# Patient Record
Sex: Male | Born: 1935 | Race: Black or African American | Hispanic: No | Marital: Single | Smoking: Former smoker
Health system: Southern US, Community
[De-identification: ages and names within clinical notes are randomized; demographics above are authoritative.]

## PROBLEM LIST (undated history)

## (undated) DIAGNOSIS — N400 Enlarged prostate without lower urinary tract symptoms: Secondary | ICD-10-CM

## (undated) DIAGNOSIS — I10 Essential (primary) hypertension: Secondary | ICD-10-CM

---

## 1992-08-24 DIAGNOSIS — I639 Cerebral infarction, unspecified: Secondary | ICD-10-CM

## 1992-08-24 HISTORY — DX: Cerebral infarction, unspecified: I63.9

## 2020-06-13 ENCOUNTER — Emergency Department (HOSPITAL_COMMUNITY): Payer: Medicare (Managed Care)

## 2020-06-13 ENCOUNTER — Emergency Department (HOSPITAL_COMMUNITY)
Admission: EM | Admit: 2020-06-13 | Discharge: 2020-06-14 | Disposition: A | Discharge status: Home / Self Care | Payer: Medicare (Managed Care) | Attending: Emergency Medicine | Admitting: Emergency Medicine

## 2020-06-13 ENCOUNTER — Encounter (HOSPITAL_COMMUNITY): Payer: Self-pay

## 2020-06-13 DIAGNOSIS — I10 Essential (primary) hypertension: Secondary | ICD-10-CM | POA: Insufficient documentation

## 2020-06-13 DIAGNOSIS — R531 Weakness: Secondary | ICD-10-CM | POA: Diagnosis not present

## 2020-06-13 DIAGNOSIS — M25512 Pain in left shoulder: Secondary | ICD-10-CM | POA: Insufficient documentation

## 2020-06-13 DIAGNOSIS — Z79899 Other long term (current) drug therapy: Secondary | ICD-10-CM | POA: Diagnosis not present

## 2020-06-13 DIAGNOSIS — R404 Transient alteration of awareness: Secondary | ICD-10-CM | POA: Diagnosis not present

## 2020-06-13 DIAGNOSIS — Z20822 Contact with and (suspected) exposure to covid-19: Secondary | ICD-10-CM | POA: Insufficient documentation

## 2020-06-13 DIAGNOSIS — I6789 Other cerebrovascular disease: Secondary | ICD-10-CM | POA: Diagnosis not present

## 2020-06-13 DIAGNOSIS — E860 Dehydration: Secondary | ICD-10-CM | POA: Insufficient documentation

## 2020-06-13 DIAGNOSIS — Z7982 Long term (current) use of aspirin: Secondary | ICD-10-CM | POA: Diagnosis not present

## 2020-06-13 DIAGNOSIS — R4182 Altered mental status, unspecified: Secondary | ICD-10-CM | POA: Diagnosis present

## 2020-06-13 LAB — CBC WITH DIFFERENTIAL/PLATELET
Abs Immature Granulocytes: 0.04 10*3/uL (ref 0.00–0.07)
Basophils Absolute: 0 10*3/uL (ref 0.0–0.1)
Basophils Relative: 0 %
Eosinophils Absolute: 0 10*3/uL (ref 0.0–0.5)
Eosinophils Relative: 0 %
HCT: 43.5 % (ref 39.0–52.0)
Hemoglobin: 13 g/dL (ref 13.0–17.0)
Immature Granulocytes: 1 %
Lymphocytes Relative: 20 %
Lymphs Abs: 1.2 10*3/uL (ref 0.7–4.0)
MCH: 27.8 pg (ref 26.0–34.0)
MCHC: 29.9 g/dL — ABNORMAL LOW (ref 30.0–36.0)
MCV: 92.9 fL (ref 80.0–100.0)
Monocytes Absolute: 0.5 10*3/uL (ref 0.1–1.0)
Monocytes Relative: 8 %
Neutro Abs: 4.3 10*3/uL (ref 1.7–7.7)
Neutrophils Relative %: 71 %
Platelets: 155 10*3/uL (ref 150–400)
RBC: 4.68 MIL/uL (ref 4.22–5.81)
RDW: 14.2 % (ref 11.5–15.5)
WBC: 6.1 10*3/uL (ref 4.0–10.5)
nRBC: 0 % (ref 0.0–0.2)

## 2020-06-13 LAB — LACTIC ACID, PLASMA: Lactic Acid, Venous: 1.9 mmol/L (ref 0.5–1.9)

## 2020-06-13 LAB — COMPREHENSIVE METABOLIC PANEL
ALT: 13 U/L (ref 0–44)
AST: 22 U/L (ref 15–41)
Albumin: 3 g/dL — ABNORMAL LOW (ref 3.5–5.0)
Alkaline Phosphatase: 38 U/L (ref 38–126)
Anion gap: 14 (ref 5–15)
BUN: 11 mg/dL (ref 8–23)
CO2: 24 mmol/L (ref 22–32)
Calcium: 8.9 mg/dL (ref 8.9–10.3)
Chloride: 99 mmol/L (ref 98–111)
Creatinine, Ser: 1.15 mg/dL (ref 0.61–1.24)
GFR, Estimated: >60 mL/min (ref >60)
Glucose, Bld: 108 mg/dL — ABNORMAL HIGH (ref 70–99)
Potassium: 4.2 mmol/L (ref 3.5–5.1)
Sodium: 137 mmol/L (ref 135–145)
Total Bilirubin: 1.2 mg/dL (ref 0.3–1.2)
Total Protein: 6.2 g/dL — ABNORMAL LOW (ref 6.5–8.1)

## 2020-06-13 LAB — URINALYSIS, ROUTINE W REFLEX MICROSCOPIC
Bacteria, UA: NONE SEEN
Bilirubin Urine: NEGATIVE
Glucose, UA: NEGATIVE mg/dL
Hgb urine dipstick: NEGATIVE
Ketones, ur: 80 mg/dL — AB
Nitrite: NEGATIVE
Protein, ur: 100 mg/dL — AB
Specific Gravity, Urine: 1.015 (ref 1.005–1.030)
pH: 7 (ref 5.0–8.0)

## 2020-06-13 LAB — CBG MONITORING, ED: Glucose-Capillary: 101 mg/dL — ABNORMAL HIGH (ref 70–99)

## 2020-06-13 LAB — RESPIRATORY PANEL BY RT PCR (FLU A&B, COVID)
Influenza A by PCR: NEGATIVE
Influenza B by PCR: NEGATIVE
SARS Coronavirus 2 by RT PCR: NEGATIVE

## 2020-06-13 LAB — PROTIME-INR
INR: 1 (ref 0.8–1.2)
Prothrombin Time: 13.1 seconds (ref 11.4–15.2)

## 2020-06-13 MED ORDER — SODIUM CHLORIDE 0.9 % IV BOLUS
1000.0000 mL | Freq: Once | INTRAVENOUS | Status: AC
  Administered 2020-06-13: 1000 mL via INTRAVENOUS

## 2020-06-13 NOTE — ED Notes (Signed)
Called PTAR 

## 2020-06-13 NOTE — ED Triage Notes (Signed)
Pt bib [facility] from home w/ c/o FTT. Pt moved here 3 weeks from [place]. Since moving pt has become progressively more weak, with decreased appetite, and decreased mental status. Pt AOx4 at baseline, AOx2 with EMS. At baseline pt is independent, however, pt has been unable to get up without assistance. Pt also c/o L shoulder pain since fall 2 months ago. Per family, pt has been taking all of his medications. EMS VSS, EMS EKG unremarkable.

## 2020-06-13 NOTE — ED Provider Notes (Signed)
4:45 PM Care assumed from Dr. Particia Nearing.  At time of transfer care, patient is awaiting results of CT head.  CT head is reassuring, plan is to get an MRI to rule out recurrent stroke.  If MRI is reassuring, anticipate ambulation and discharge home.  This plan was discussed with the patient's family by Dr. Particia Nearing.  Anticipate discharge after the rehydration improved the patient's appearance and reassuring imaging.  If there is evidence of new stroke, anticipate admission.  CT head was completed did not show bleed or acute stroke.  It showed an old stroke.  I spoke with patient's daughter who agreed with the previous plan of MRI and if this is reassuring, discharge home via PTR for PCP follow-up.  Patient is awaiting on MRI results.  7:57 PM MRI just returned showing no acute stroke.  I called the patient's daughter and she is in agreement with discharge.  Patient will need PTAR to get back home.  Patient will need to follow-up with PCP and maintain hydration at home.  He agreed with plan of care and was discharged in good condition.   Clinical Impression: 1. Transient alteration of awareness   2. Dehydration     Disposition: Discharge  Condition: Good  I have discussed the results, Dx and Tx plan with the pt(& family if present). He/she/they expressed understanding and agree(s) with the plan. Discharge instructions discussed at great length. Strict return precautions discussed and pt &/or family have verbalized understanding of the instructions. No further questions at time of discharge.    New Prescriptions   No medications on file    Follow Up: [facility] [address] [place] [address] [phone] Schedule an appointment as soon as possible for a visit    [facility] EMERGENCY DEPARTMENT [address] [id number] mc [place] [address] [phone]          Scotlyn Mccranie,  Canary Brim, MD 06/13/20 2000

## 2020-06-13 NOTE — ED Notes (Signed)
Pt family (daughter) updated. Would like to be notified when patient is being brought home by [facility]. Number is in chart.

## 2020-06-13 NOTE — ED Provider Notes (Signed)
[facility] EMERGENCY DEPARTMENT Provider Note   CSN: [id number] Arrival date & time: 06/13/20  1433     History Chief Complaint  Patient presents with  . Failure To Thrive    Brian Wiley is a 86 y.o. male.  Pt presents to the ED today with AMS.  Pt and his wife have moved to their daughter's house from [place] about 3 weeks ago.  His daughter gives most of the hx.  He has gotten progressively weak over the last week.  He's been sleeping a lot more.  He has had a decreased appetite.  Daughter could not get him out of bed this morning.  Pt has not taken his meds today, but has been compliant.  Pt has some left shoulder pain from a fall about 2 months ago, but no new pain.        PMHx:  HTN, CVA, BPH, high cholesterol  PSurgHx:  Appendectomy   No family history on file.  Social History   Tobacco Use  . Smoking status: Not on file  Substance Use Topics  . Alcohol use: Not on file  . Drug use: Not on file  No tobacco or alcohol for 35 years  Home Medications Prior to Admission medications   Medication Sig Start Date End Date Taking? Authorizing Provider  aspirin EC 81 MG tablet Take 81 mg by mouth daily. Swallow whole.   Yes [provider]  atorvastatin (LIPITOR) 10 MG tablet Take 10 mg by mouth daily.   Yes [provider]  carvedilol (COREG) 6.25 MG tablet Take 6.25 mg by mouth 2 (two) times daily with a meal.   Yes [provider]  finasteride (PROSCAR) 5 MG tablet Take 5 mg by mouth at bedtime.   Yes [provider]  hydrochlorothiazide (HYDRODIURIL) 25 MG tablet Take 25 mg by mouth daily.   Yes [provider]  tamsulosin (FLOMAX) 0.4 MG CAPS capsule Take 0.4 mg by mouth at bedtime.   Yes [provider]    Allergies    Patient has no known allergies. NKDA  Review of Systems   Review of Systems  Musculoskeletal:       Left shoulder pain  Neurological: Positive for weakness.  All  other systems reviewed and are negative.   Physical Exam Updated Vital Signs BP (!) 179/85   Pulse (!) 44   Temp 97.8 F (36.6 C) (Oral)   Resp 16   SpO2 97%   Physical Exam Vitals and nursing note reviewed.  Constitutional:      Appearance: Normal appearance.  HENT:     Head: Normocephalic and atraumatic.     Right Ear: External ear normal.     Left Ear: External ear normal.     Nose: Nose normal.     Mouth/Throat:     Mouth: Mucous membranes are dry.  Eyes:     Extraocular Movements: Extraocular movements intact.     Conjunctiva/sclera: Conjunctivae normal.     Pupils: Pupils are equal, round, and reactive to light.  Cardiovascular:     Rate and Rhythm: Normal rate and regular rhythm.     Pulses: Normal pulses.     Heart sounds: Normal heart sounds.  Pulmonary:     Effort: Pulmonary effort is normal.     Breath sounds: Normal breath sounds.  Abdominal:     General: Abdomen is flat. Bowel sounds are normal.     Palpations: Abdomen is soft.  Musculoskeletal:  General: Normal range of motion.     Cervical back: Normal range of motion and neck supple.  Skin:    General: Skin is warm.     Capillary Refill: Capillary refill takes less than 2 seconds.  Neurological:     Mental Status: He is alert. He is confused.     Comments: Right arm and leg weakness from prior stroke.  Psychiatric:        Mood and Affect: Mood normal.        Behavior: Behavior normal.     ED Results / Procedures / Treatments   Labs (all labs ordered are listed, but only abnormal results are displayed) Labs Reviewed  CBC WITH DIFFERENTIAL/PLATELET - Abnormal; Notable for the following components:      Result Value   MCHC 29.9 (*)    All other components within normal limits  COMPREHENSIVE METABOLIC PANEL - Abnormal; Notable for the following components:   Glucose, Bld 108 (*)    Total Protein 6.2 (*)    Albumin 3.0 (*)    All other components within normal limits  URINALYSIS,  ROUTINE W REFLEX MICROSCOPIC - Abnormal; Notable for the following components:   Ketones, ur 80 (*)    Protein, ur 100 (*)    Leukocytes,Ua TRACE (*)    All other components within normal limits  CBG MONITORING, ED - Abnormal; Notable for the following components:   Glucose-Capillary 101 (*)    All other components within normal limits  RESPIRATORY PANEL BY RT PCR (FLU A&B, COVID)  LACTIC ACID, PLASMA  PROTIME-INR  AMMONIA    EKG EKG Interpretation  Date/Time:  Thursday June 13 2020 14:43:55 EDT Ventricular Rate:  65 PR Interval:    QRS Duration: 99 QT Interval:  451 QTC Calculation: 469 R Axis:   6 Text Interpretation: Sinus rhythm No old tracing to compare Confirmed by Jacalyn Lefevre [phone]) on 06/13/2020 3:54:40 PM   Radiology DG Chest Portable 1 View  Result Date: 06/13/2020 CLINICAL DATA:  Cough. EXAM: PORTABLE CHEST 1 VIEW COMPARISON:  None. FINDINGS: Mild cardiomegaly is noted. No pneumothorax is noted. Left lung is clear. Elevated right hemidiaphragm is noted with mild right basilar subsegmental atelectasis. Bony thorax is unremarkable. IMPRESSION: Elevated right hemidiaphragm with mild right basilar subsegmental atelectasis. Aortic Atherosclerosis (ICD10-I70.0). Electronically Signed   By: Lupita Raider M.D.   On: 06/13/2020 15:21   DG Shoulder Left Portable  Result Date: 06/13/2020 CLINICAL DATA:  Weakness.  Cough. EXAM: LEFT SHOULDER COMPARISON:  No prior. FINDINGS: Acromioclavicular and glenohumeral degenerative change. No acute bony abnormality identified. No evidence of fracture or dislocation. IMPRESSION: Acromioclavicular and glenohumeral degenerative change. No acute abnormality identified. Electronically Signed   By: Maisie Fus  Register   On: 06/13/2020 15:21    Procedures Procedures (including critical care time)  Medications Ordered in ED Medications  sodium chloride 0.9 % bolus 1,000 mL (1,000 mLs Intravenous New Bag/Given 06/13/20 1512)    ED  Course  I have reviewed the triage vital signs and the nursing notes.  Pertinent labs & imaging results that were available during my care of the patient were reviewed by me and considered in my medical decision making (see chart for details).    MDM Rules/Calculators/A&P                          Labs unremarkable.  UA shows ketones, but no infections.  Pt's CT head is pending.  Pt's daughter updated.  PT signed out to Dr. Rush Landmark at shift change.  Covid is pending.  Pt has been fully vaccinated.  Pt is looking better after fluids.  Final Clinical Impression(s) / ED Diagnoses Final diagnoses:  Transient alteration of awareness  Dehydration    Rx / DC Orders ED Discharge Orders    None       Jacalyn Lefevre, MD 06/13/20 [id number]

## 2020-06-13 NOTE — Discharge Instructions (Signed)
Your imaging today showed chronic brain changes but no evidence of acute bleed or acute strokes.  The previous team's plan was to let you go home if your imaging did not show acute stroke which he did not.  You were rehydrated today with IV fluids so please try to continue improving your hydration at home.  Please follow-up with a primary care physician.  If any symptoms change or worsen, please return to the nearest emergency department.

## 2020-06-14 NOTE — ED Notes (Signed)
Patient removing cardiac leads/bp cuff/clothing. Patient refusing to keep anything on.

## 2020-06-18 ENCOUNTER — Observation Stay (HOSPITAL_COMMUNITY): Payer: Medicare (Managed Care)

## 2020-06-18 ENCOUNTER — Inpatient Hospital Stay (HOSPITAL_COMMUNITY)
Admission: EM | Admit: 2020-06-18 | Discharge: 2020-06-22 | DRG: 690 | Disposition: A | Discharge status: Skilled Nursing Facility | Payer: Medicare (Managed Care) | Attending: Osteopathic Medicine | Admitting: Osteopathic Medicine

## 2020-06-18 ENCOUNTER — Encounter (HOSPITAL_COMMUNITY): Payer: Self-pay

## 2020-06-18 ENCOUNTER — Emergency Department (HOSPITAL_COMMUNITY): Payer: Medicare (Managed Care)

## 2020-06-18 DIAGNOSIS — J9811 Atelectasis: Secondary | ICD-10-CM | POA: Diagnosis present

## 2020-06-18 DIAGNOSIS — R410 Disorientation, unspecified: Secondary | ICD-10-CM | POA: Diagnosis present

## 2020-06-18 DIAGNOSIS — G9349 Other encephalopathy: Secondary | ICD-10-CM | POA: Diagnosis present

## 2020-06-18 DIAGNOSIS — N390 Urinary tract infection, site not specified: Principal | ICD-10-CM | POA: Diagnosis present

## 2020-06-18 DIAGNOSIS — N400 Enlarged prostate without lower urinary tract symptoms: Secondary | ICD-10-CM | POA: Diagnosis present

## 2020-06-18 DIAGNOSIS — Z8673 Personal history of transient ischemic attack (TIA), and cerebral infarction without residual deficits: Secondary | ICD-10-CM

## 2020-06-18 DIAGNOSIS — E876 Hypokalemia: Secondary | ICD-10-CM | POA: Diagnosis present

## 2020-06-18 DIAGNOSIS — N179 Acute kidney failure, unspecified: Secondary | ICD-10-CM | POA: Diagnosis present

## 2020-06-18 DIAGNOSIS — Z20822 Contact with and (suspected) exposure to covid-19: Secondary | ICD-10-CM | POA: Diagnosis present

## 2020-06-18 DIAGNOSIS — G9340 Encephalopathy, unspecified: Secondary | ICD-10-CM | POA: Diagnosis present

## 2020-06-18 DIAGNOSIS — I10 Essential (primary) hypertension: Secondary | ICD-10-CM | POA: Diagnosis present

## 2020-06-18 DIAGNOSIS — Z79899 Other long term (current) drug therapy: Secondary | ICD-10-CM

## 2020-06-18 DIAGNOSIS — R32 Unspecified urinary incontinence: Secondary | ICD-10-CM | POA: Diagnosis present

## 2020-06-18 DIAGNOSIS — R531 Weakness: Secondary | ICD-10-CM | POA: Diagnosis not present

## 2020-06-18 DIAGNOSIS — Z87891 Personal history of nicotine dependence: Secondary | ICD-10-CM

## 2020-06-18 DIAGNOSIS — E860 Dehydration: Secondary | ICD-10-CM | POA: Diagnosis present

## 2020-06-18 DIAGNOSIS — Z7982 Long term (current) use of aspirin: Secondary | ICD-10-CM

## 2020-06-18 DIAGNOSIS — R4182 Altered mental status, unspecified: Secondary | ICD-10-CM

## 2020-06-18 DIAGNOSIS — R778 Other specified abnormalities of plasma proteins: Secondary | ICD-10-CM | POA: Diagnosis present

## 2020-06-18 DIAGNOSIS — F05 Delirium due to known physiological condition: Secondary | ICD-10-CM | POA: Diagnosis present

## 2020-06-18 HISTORY — DX: Essential (primary) hypertension: I10

## 2020-06-18 HISTORY — DX: Benign prostatic hyperplasia without lower urinary tract symptoms: N40.0

## 2020-06-18 LAB — CBC WITH DIFFERENTIAL/PLATELET
Abs Immature Granulocytes: 0.06 10*3/uL (ref 0.00–0.07)
Basophils Absolute: 0 10*3/uL (ref 0.0–0.1)
Basophils Relative: 0 %
Eosinophils Absolute: 0.1 10*3/uL (ref 0.0–0.5)
Eosinophils Relative: 1 %
HCT: 44.3 % (ref 39.0–52.0)
Hemoglobin: 13.4 g/dL (ref 13.0–17.0)
Immature Granulocytes: 1 %
Lymphocytes Relative: 23 %
Lymphs Abs: 2.2 10*3/uL (ref 0.7–4.0)
MCH: 27.6 pg (ref 26.0–34.0)
MCHC: 30.2 g/dL (ref 30.0–36.0)
MCV: 91.2 fL (ref 80.0–100.0)
Monocytes Absolute: 1.2 10*3/uL — ABNORMAL HIGH (ref 0.1–1.0)
Monocytes Relative: 12 %
Neutro Abs: 6.2 10*3/uL (ref 1.7–7.7)
Neutrophils Relative %: 63 %
Platelets: 182 10*3/uL (ref 150–400)
RBC: 4.86 MIL/uL (ref 4.22–5.81)
RDW: 14.5 % (ref 11.5–15.5)
WBC: 9.8 10*3/uL (ref 4.0–10.5)
nRBC: 0 % (ref 0.0–0.2)

## 2020-06-18 LAB — URINALYSIS, ROUTINE W REFLEX MICROSCOPIC
Bilirubin Urine: NEGATIVE
Glucose, UA: NEGATIVE mg/dL
Ketones, ur: NEGATIVE mg/dL
Nitrite: NEGATIVE
Protein, ur: 30 mg/dL — AB
Specific Gravity, Urine: 1.019 (ref 1.005–1.030)
pH: 5 (ref 5.0–8.0)

## 2020-06-18 LAB — COMPREHENSIVE METABOLIC PANEL
ALT: 14 U/L (ref 0–44)
AST: 20 U/L (ref 15–41)
Albumin: 3.1 g/dL — ABNORMAL LOW (ref 3.5–5.0)
Alkaline Phosphatase: 41 U/L (ref 38–126)
Anion gap: 10 (ref 5–15)
BUN: 28 mg/dL — ABNORMAL HIGH (ref 8–23)
CO2: 31 mmol/L (ref 22–32)
Calcium: 8.9 mg/dL (ref 8.9–10.3)
Chloride: 97 mmol/L — ABNORMAL LOW (ref 98–111)
Creatinine, Ser: 1.7 mg/dL — ABNORMAL HIGH (ref 0.61–1.24)
GFR, Estimated: 39 mL/min — ABNORMAL LOW (ref >60)
Glucose, Bld: 140 mg/dL — ABNORMAL HIGH (ref 70–99)
Potassium: 3.1 mmol/L — ABNORMAL LOW (ref 3.5–5.1)
Sodium: 138 mmol/L (ref 135–145)
Total Bilirubin: 0.8 mg/dL (ref 0.3–1.2)
Total Protein: 6.5 g/dL (ref 6.5–8.1)

## 2020-06-18 LAB — TROPONIN I (HIGH SENSITIVITY)
Troponin I (High Sensitivity): 23 ng/L — ABNORMAL HIGH (ref <18)
Troponin I (High Sensitivity): 21 ng/L — ABNORMAL HIGH (ref <18)

## 2020-06-18 LAB — RESPIRATORY PANEL BY RT PCR (FLU A&B, COVID)
Influenza A by PCR: NEGATIVE
Influenza B by PCR: NEGATIVE
SARS Coronavirus 2 by RT PCR: NEGATIVE

## 2020-06-18 LAB — MAGNESIUM: Magnesium: 2.3 mg/dL (ref 1.7–2.4)

## 2020-06-18 LAB — AMMONIA: Ammonia: 35 umol/L (ref 9–35)

## 2020-06-18 MED ORDER — ACETAMINOPHEN 325 MG PO TABS: 650.0000 mg | ORAL_TABLET | Freq: Four times a day (QID) | ORAL | Status: DC | PRN

## 2020-06-18 MED ORDER — ENOXAPARIN SODIUM 40 MG/0.4ML ~~LOC~~ SOLN
40.0000 mg | SUBCUTANEOUS | Status: DC
  Administered 2020-06-18 – 2020-06-21 (×4): 40 mg via SUBCUTANEOUS
  Filled 2020-06-18 (×4): qty 0.4

## 2020-06-18 MED ORDER — ACETAMINOPHEN 650 MG RE SUPP: 650.0000 mg | Freq: Four times a day (QID) | RECTAL | Status: DC | PRN

## 2020-06-18 MED ORDER — POLYETHYLENE GLYCOL 3350 17 G PO PACK: 17.0000 g | PACK | Freq: Every day | ORAL | Status: DC | PRN

## 2020-06-18 MED ORDER — SODIUM CHLORIDE 0.9 % IV SOLN
1.0000 g | Freq: Every day | INTRAVENOUS | Status: DC
  Administered 2020-06-18 – 2020-06-21 (×4): 1 g via INTRAVENOUS
  Filled 2020-06-18: qty 1
  Filled 2020-06-18 (×4): qty 10

## 2020-06-18 MED ORDER — BISACODYL 5 MG PO TBEC: 5.0000 mg | DELAYED_RELEASE_TABLET | Freq: Every day | ORAL | Status: DC | PRN

## 2020-06-18 MED ORDER — SODIUM CHLORIDE 0.9 % IV BOLUS
1000.0000 mL | Freq: Once | INTRAVENOUS | Status: AC
  Administered 2020-06-18: 1000 mL via INTRAVENOUS

## 2020-06-18 MED ORDER — FINASTERIDE 5 MG PO TABS
5.0000 mg | ORAL_TABLET | Freq: Every day | ORAL | Status: DC
  Administered 2020-06-18 – 2020-06-21 (×4): 5 mg via ORAL
  Filled 2020-06-18 (×5): qty 1

## 2020-06-18 MED ORDER — SODIUM CHLORIDE 0.9 % IV SOLN: 1.0000 g | INTRAVENOUS | Status: DC

## 2020-06-18 MED ORDER — SODIUM CHLORIDE 0.9 % IV SOLN
INTRAVENOUS | Status: AC
  Administered 2020-06-20: 1 mL via INTRAVENOUS

## 2020-06-18 MED ORDER — SODIUM CHLORIDE 0.9% FLUSH
3.0000 mL | Freq: Two times a day (BID) | INTRAVENOUS | Status: DC
  Administered 2020-06-18 – 2020-06-22 (×7): 3 mL via INTRAVENOUS

## 2020-06-18 MED ORDER — ASPIRIN EC 81 MG PO TBEC
81.0000 mg | DELAYED_RELEASE_TABLET | Freq: Every day | ORAL | Status: DC
  Administered 2020-06-19 – 2020-06-22 (×4): 81 mg via ORAL
  Filled 2020-06-18 (×4): qty 1

## 2020-06-18 MED ORDER — TAMSULOSIN HCL 0.4 MG PO CAPS
0.4000 mg | ORAL_CAPSULE | Freq: Every day | ORAL | Status: DC
  Administered 2020-06-18 – 2020-06-21 (×4): 0.4 mg via ORAL
  Filled 2020-06-18 (×4): qty 1

## 2020-06-18 MED ORDER — CARVEDILOL 6.25 MG PO TABS
6.2500 mg | ORAL_TABLET | Freq: Two times a day (BID) | ORAL | Status: DC
  Administered 2020-06-19 – 2020-06-22 (×6): 6.25 mg via ORAL
  Filled 2020-06-18 (×6): qty 1

## 2020-06-18 NOTE — ED Triage Notes (Signed)
To room via EMS from home.  Pt seen in ED 06-13-20 for AMS and weakness.  Pt just moved to daughters home from [place] about a month ago and since then has been getting weaker.   Pt Alert to name and DOB, states he is in [place], not oriented to situation, time.   EMS BP 140/90 HR 88 RR 20 SpO2 92% Temp 98.0 BS 201

## 2020-06-18 NOTE — ED Provider Notes (Signed)
[facility] EMERGENCY DEPARTMENT Provider Note   CSN: [id number] Arrival date & time: 06/18/20  1626     History Chief Complaint  Patient presents with   Weakness    Brian Wiley is a 86 y.o. 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 Patient just moved from [place] with his wife to live with his daughter here 4 weeks ago. Most of the history were obtained from his daughter. She states that patient is alert and able to perform all ADLs independently at baseline. However 1 week ago patient started to decline and refused to get out of bed. He also found urine and feces on bed. He also seems to be weaker and not ambulating as well. She states that his memory and cognitive function also declining. Patient did have memory issue when he was in [place] and she thinks that the new moving has made it worse. Patient has not had any falling episodes in the last 6 months. Patient was seen on 10/21/121 for the same issue and had unremarkable CBC, CMP, UA, lactic acid. CT and brain MRI were also negative for acute changes. His daughter states that patient is not improved since he was discharged.  He is living with his wife who just finished treatment for breast cancer and cannot take care of patient in the state.  They are in the process of getting home health. During examination, patient is somnolent but arousable to verbal stimulation. He is oriented to person and place. Patient denies chest pain, shortness of breath, abdominal pain, headache, urinary symptoms, constipation, diarrhea, new weakness of extremities, or headache.  HPI     History reviewed. No pertinent past medical history.  Patient Active Problem List   Diagnosis Date Noted   Disorientation 06/18/2020   Altered mental status 06/18/2020   AKI (acute kidney injury) (HCC) 06/18/2020   Hypokalemia 06/18/2020    Elevated troponin 06/18/2020    History reviewed. No pertinent surgical history.     History reviewed. No pertinent family history.  Social History   Tobacco Use   Smoking status: Not on file  Substance Use Topics   Alcohol use: Not on file   Drug use: Not on file    Home Medications Prior to Admission medications   Medication Sig Start Date End Date Taking? Authorizing Provider  aspirin EC 81 MG tablet Take 81 mg by mouth daily. Swallow whole.    [provider]  atorvastatin (LIPITOR) 10 MG tablet Take 10 mg by mouth daily.    [provider]  carvedilol (COREG) 6.25 MG tablet Take 6.25 mg by mouth 2 (two) times daily with a meal.    [provider]  finasteride (PROSCAR) 5 MG tablet Take 5 mg by mouth at bedtime.    [provider]  hydrochlorothiazide (HYDRODIURIL) 25 MG tablet Take 25 mg by mouth daily.    [provider]  tamsulosin (FLOMAX) 0.4 MG CAPS capsule Take 0.4 mg by mouth at bedtime.    [provider]    Allergies    Patient has no known allergies.  Review of Systems   Review of Systems  Constitutional: Positive for appetite change. Negative for chills and fever.  Respiratory: Positive for cough. Negative for shortness of breath.   Cardiovascular: Negative for chest pain.  Gastrointestinal: Negative for abdominal pain, constipation and diarrhea.  Genitourinary: Negative for difficulty urinating, dysuria  and urgency.  Neurological: Negative for weakness and headaches.    Physical Exam Updated Vital Signs BP 128/86    Pulse 64    Resp 17    SpO2 91%   Physical Exam Constitutional:      General: He is not in acute distress.    Appearance: He is obese.     Comments: Somnolent but arousable to verbal stimulation  HENT:     Head: Normocephalic.  Eyes:     General:        Right eye: No discharge.        Left eye: No discharge.     Extraocular Movements: Extraocular movements intact.      Conjunctiva/sclera: Conjunctivae normal.     Pupils: Pupils are equal, round, and reactive to light.  Cardiovascular:     Rate and Rhythm: Normal rate and regular rhythm.  Pulmonary:     Effort: No respiratory distress.     Breath sounds: Normal breath sounds. No wheezing.  Abdominal:     General: Bowel sounds are normal.     Palpations: Abdomen is soft.     Tenderness: There is no abdominal tenderness.  Musculoskeletal:        General: No tenderness. Normal range of motion.     Cervical back: Normal range of motion.     Right lower leg: No edema.     Left lower leg: No edema.  Skin:    General: Skin is warm.     Coloration: Skin is not jaundiced.  Neurological:     Comments: PERRLA Cranial nerves no deficit Strength 5/5 left upper extremity Slightly weaker right upper extremity Strength 5/5 left and right lower extremities  Psychiatric:        Mood and Affect: Mood normal.     ED Results / Procedures / Treatments   Labs (all labs ordered are listed, but only abnormal results are displayed) Labs Reviewed  URINALYSIS, ROUTINE W REFLEX MICROSCOPIC - Abnormal; Notable for the following components:      Result Value   APPearance HAZY (*)    Hgb urine dipstick SMALL (*)    Protein, ur 30 (*)    Leukocytes,Ua MODERATE (*)    Bacteria, UA RARE (*)    All other components within normal limits  COMPREHENSIVE METABOLIC PANEL - Abnormal; Notable for the following components:   Potassium 3.1 (*)    Chloride 97 (*)    Glucose, Bld 140 (*)    BUN 28 (*)    Creatinine, Ser 1.70 (*)    Albumin 3.1 (*)    GFR, Estimated 39 (*)    All other components within normal limits  CBC WITH DIFFERENTIAL/PLATELET - Abnormal; Notable for the following components:   Monocytes Absolute 1.2 (*)    All other components within normal limits  TROPONIN I (HIGH SENSITIVITY) - Abnormal; Notable for the following components:   Troponin I (High Sensitivity) 23 (*)    All other components within  normal limits  RESPIRATORY PANEL BY RT PCR (FLU A&B, COVID)  URINE CULTURE  AMMONIA  TROPONIN I (HIGH SENSITIVITY)    EKG EKG Interpretation  Date/Time:  Tuesday June 18 2020 16:37:16 EDT Ventricular Rate:  85 PR Interval:    QRS Duration: 102 QT Interval:  394 QTC Calculation: 469 R Axis:   14 Text Interpretation: Sinus rhythm Atrial premature complexes No significant change since last tracing Confirmed by Alvira Monday ([id number]) on 06/18/2020 5:37:17 PM   Radiology DG Chest 2 View  Result Date: 06/18/2020 CLINICAL DATA:  Generalized weakness EXAM: CHEST - 2 VIEW COMPARISON:  Radiograph 06/13/2020 FINDINGS: Chronic elevation of the right hemidiaphragm with some bandlike opacity in the right mid lung compatible with subsegmental atelectasis or scarring. Some additional hazy opacity in the lung bases and in particular the right basilar periphery likely reflecting further atelectatic change though early airspace disease is difficult to fully exclude. Stable cardiomediastinal contours with a calcified aorta. No pneumothorax. No visible effusion. No acute osseous or soft tissue abnormality. Telemetry leads overlie the chest. IMPRESSION: Chronic elevation of the right hemidiaphragm with likely adjacent subsegmental atelectasis or scarring. Additional hazy basilar opacity particularly within the right lung base likely reflecting further atelectatic change though early airspace disease is difficult to fully exclude. Aortic Atherosclerosis (ICD10-I70.0). Electronically Signed   By: Kreg Shropshire M.D.   On: 06/18/2020 18:51    Procedures Procedures (including critical care time)  Medications Ordered in ED Medications  sodium chloride 0.9 % bolus 1,000 mL (has no administration in time range)    ED Course  I have reviewed the triage vital signs and the nursing notes.  Pertinent labs & imaging results that were available during my care of the patient were reviewed by me and considered  in my medical decision making (see chart for details).     MDM Rules/Calculators/A&P                          Patient presents to the ED for altered mental status.  Patient was recently relocated to [place] from [facility] 4 weeks ago.  Per his daughter, patient started to decline 1 week ago in which he becomes less responsive, refused to get out of bed, reduced appetite.  Family also found urine and feces on his bed.  Patient is interactive and able to perform his ADLs independently at baseline.  He was seen on 06/13/2020 for similar complaint and had a normal CT and brain MRI.  His lab works were also reassuring.  His daughter states that patient is not improving after discharge and they are in the process of getting home health.  CBC unremarkable.  CMP shows AKI with creatinine of 1.7 likely due to dehydration and low p.o. intake.  Potassium is low at 3.1.  Ammonia negative.  Troponin is now elevated 23 but low suspicion for ACS.  EKG is also unremarkable.  His UA suggest that patient may have a UTI.  Patient will be admitted for treatment of his UTI, hypokalemia and AKI and hopefully to improve his altered mental status.  Signed out to Dr. Alinda Money.   Final Clinical Impression(s) / ED Diagnoses Final diagnoses:  Altered mental status, unspecified altered mental status type  AKI (acute kidney injury) (HCC)  Hypokalemia  Elevated troponin    Rx / DC Orders ED Discharge Orders    None       Doran Stabler, DO 06/18/20 2151    Alvira Monday, MD 06/20/20 1549

## 2020-06-18 NOTE — H&P (Signed)
History and Physical   Brian Wiley DGL:[id number] DOB: October 08, 1932 DOA: 06/18/2020  PCP: Patient, [name]   Patient coming from: Home  Chief Complaint: Encephalopathy  HPI: Brian Wiley is a 86 y.o. male with medical history significant of CVA in 1994, hypertension, BPH, arthritis who presents with progressive encephalopathy.  Patient unable to significantly participate in history given his encephalopathy, most history obtained via chart review and with assistance of family.  Patient and his wife moved to their daughter's house from [facility] about 1 month ago.  Since arriving, his daughter has noticed him getting progressively weak and this accelerated some 2 weeks ago and he became more lethargic and began eating less and sleeping more.  Patient was brought to the emergency department on 1021 for evaluation of this patient was determined to be dehydrated and he improved after fluids.  Given his history of stroke he was reevaluated with imaging including CT head and MRI which were negative for repeat infarct or bleed.  Patient was discharged home from the ED at that time.  Daughter states that since discharge he has continued to have progressive weakness and encephalopathy.  He is now alert and oriented to person only.  He continues to have a decreased appetite and sleeps most of the day.  He has had some episodes of incontinence.  She had worked on getting some home health and when the home health nursing came to evaluate patient they recommend him be seen in the ED.  Per her report he has been having increased urinary frequency in addition to looser stools.  She has not noted nor has he complained of fever, chest pain, shortness of breath, abdominal pain.  ED Course: Vital signs stable in the ED.  Lab work significant for K3.1, creatinine 1.7 (baseline 1.15).  Initial troponin mildly elevated at 23, repeat pending.  Ammonia negative, respiratory viral panel for flu and Covid negative.  UA  showed evidence of UTI, that did not exist on prior UA on 10/21.  Checks x-ray showed continued right lower lobe atelectasis.   Review of Systems: As per HPI otherwise all other systems reviewed and are negative.  Past Medical History:  Diagnosis Date  . BPH (benign prostatic hyperplasia)   . CVA (cerebral vascular accident) (HCC) 1994  . HTN (hypertension)    History reviewed. No pertinent surgical history.  Social History  reports that he has quit smoking. His smoking use included cigarettes. He does not have any smokeless tobacco history on file. He reports previous alcohol use. No history on file for drug use.  No Known Allergies  History reviewed. No pertinent family history.  Prior to Admission medications   Medication Sig Start Date End Date Taking? Authorizing Provider  aspirin EC 81 MG tablet Take 81 mg by mouth daily. Swallow whole.    [provider]  atorvastatin (LIPITOR) 10 MG tablet Take 10 mg by mouth daily.    [provider]  carvedilol (COREG) 6.25 MG tablet Take 6.25 mg by mouth 2 (two) times daily with a meal.    [provider]  finasteride (PROSCAR) 5 MG tablet Take 5 mg by mouth at bedtime.    [provider]  hydrochlorothiazide (HYDRODIURIL) 25 MG tablet Take 25 mg by mouth daily.    [provider]  tamsulosin (FLOMAX) 0.4 MG CAPS capsule Take 0.4 mg by mouth at bedtime.    [provider]    Physical Exam: Vitals:   06/18/20 1900  BP:  128/86  Pulse: 64  Resp: 17  SpO2: 91%   Physical Exam Constitutional:      General: He is not in acute distress.    Appearance: Normal appearance.     Comments: Pleasantly confused elderly male  HENT:     Head: Normocephalic and atraumatic.     Mouth/Throat:     Mouth: Mucous membranes are moist.     Pharynx: Oropharynx is clear.  Eyes:     Extraocular Movements: Extraocular movements intact.     Pupils: Pupils are equal, round, and reactive to light.   Cardiovascular:     Rate and Rhythm: Normal rate and regular rhythm.     Pulses: Normal pulses.     Heart sounds: Normal heart sounds.  Pulmonary:     Effort: Pulmonary effort is normal. No respiratory distress.     Breath sounds: Normal breath sounds.  Abdominal:     General: Bowel sounds are normal. There is no distension.     Tenderness: There is no abdominal tenderness.     Comments: Somewhat firm abdomen, mildly increased bowl sounds  Musculoskeletal:        General: No swelling or deformity.  Skin:    General: Skin is warm and dry.  Neurological:     General: No focal deficit present.     Mental Status: Mental status is at baseline.     Comments: Alert and oriented to person only Able to move all 4 extremities spontaneously against resistance Cranial nerve exam grossly intact Patient able to follow simple commands     Labs on Admission: I have personally reviewed following labs and imaging studies  CBC: Recent Labs  Lab 06/13/20 1448 06/18/20 1844  WBC 6.1 9.8  NEUTROABS 4.3 6.2  HGB 13.0 13.4  HCT 43.5 44.3  MCV 92.9 91.2  PLT 155 182    Basic Metabolic Panel: Recent Labs  Lab 06/13/20 1448 06/18/20 1844  NA 137 138  K 4.2 3.1*  CL 99 97*  CO2 24 31  GLUCOSE 108* 140*  BUN 11 28*  CREATININE 1.15 1.70*  CALCIUM 8.9 8.9    GFR: CrCl cannot be calculated (Unknown ideal weight.).  Liver Function Tests: Recent Labs  Lab 06/13/20 1448 06/18/20 1844  AST 22 20  ALT 13 14  ALKPHOS 38 41  BILITOT 1.2 0.8  PROT 6.2* 6.5  ALBUMIN 3.0* 3.1*    Urine analysis:    Component Value Date/Time   COLORURINE YELLOW 06/18/2020 1933   APPEARANCEUR HAZY (A) 06/18/2020 1933   LABSPEC 1.019 06/18/2020 1933   PHURINE 5.0 06/18/2020 1933   GLUCOSEU NEGATIVE 06/18/2020 1933   HGBUR SMALL (A) 06/18/2020 1933   BILIRUBINUR NEGATIVE 06/18/2020 1933   KETONESUR NEGATIVE 06/18/2020 1933   PROTEINUR 30 (A) 06/18/2020 1933   NITRITE NEGATIVE 06/18/2020 1933    LEUKOCYTESUR MODERATE (A) 06/18/2020 1933    Radiological Exams on Admission: DG Chest 2 View  Result Date: 06/18/2020 CLINICAL DATA:  Generalized weakness EXAM: CHEST - 2 VIEW COMPARISON:  Radiograph 06/13/2020 FINDINGS: Chronic elevation of the right hemidiaphragm with some bandlike opacity in the right mid lung compatible with subsegmental atelectasis or scarring. Some additional hazy opacity in the lung bases and in particular the right basilar periphery likely reflecting further atelectatic change though early airspace disease is difficult to fully exclude. Stable cardiomediastinal contours with a calcified aorta. No pneumothorax. No visible effusion. No acute osseous or soft tissue abnormality. Telemetry leads overlie the chest. IMPRESSION: Chronic elevation of  the right hemidiaphragm with likely adjacent subsegmental atelectasis or scarring. Additional hazy basilar opacity particularly within the right lung base likely reflecting further atelectatic change though early airspace disease is difficult to fully exclude. Aortic Atherosclerosis (ICD10-I70.0). Electronically Signed   By: Kreg Shropshire M.D.   On: 06/18/2020 18:51    EKG: Independently reviewed.  Sinus rhythm with PAC.  Assessment/Plan Principal Problem:   Encephalopathy Active Problems:   AKI (acute kidney injury) (HCC)   Hypokalemia   Elevated troponin  UTI  Weakness Encephalopathy > Patient with increasing weakness since arrival with daughter a month ago and increasing encephalopathy for about the past 2 weeks > Seen in ED on 10/21, felt to improved with fluids at that time.  CT and MRI negative for recurrent CVA at that time > In the ED today, calcium and sodium normal.  Mild AKI with signs of UTI on UA.  Increased urinary frequency per patient's daughter.  Likely a degree of delirium due to infection. > Likely some degree of infectious delirium.  Abdomen somewhat firm and reported looser stools will obtain abdominal  plain film to rule out any constipation - We will cover for UTI with ceftriaxone - Follow urine culture - Portable abdominal x-ray  - Check TSH, UDS for completion - PT/OT eval  Hypokalemia Acute renal failure > In the setting of decreased p.o. intake.  Creatinine 1.7 from baseline of 1.15.  Potassium 3.1. - NS at 125 cc/h - KCl 40 mEq x 1 - Add on magnesium - Follow BMP  Troponinemia > Mild troponin elevation to 23 - Continue to trend, likely demand  DVT prophylaxis: Lovenox Code Status:   Full  Family Communication: Spoke with daughter, Annice Pih, at [phone]    Disposition Plan:   Patient is from:  Home  Anticipated DC to:  Pending clinical course  Anticipated DC date:  Pending response to therapy    Consults called:  None  Admission status:  Observation, telemetry  Severity of Illness: The appropriate patient status for this patient is OBSERVATION. Observation status is judged to be reasonable and necessary in order to provide the required intensity of service to ensure the patient's safety. The patient's presenting symptoms, physical exam findings, and initial radiographic and laboratory data in the context of their medical condition is felt to place them at decreased risk for further clinical deterioration. Furthermore, it is anticipated that the patient will be medically stable for discharge from the hospital within 2 midnights of admission. The following factors support the patient status of observation.   " The patient's presenting symptoms include encephalopathy. " The physical exam findings include encephalopathy. " The initial radiographic and laboratory data are consistent with AKI and UTI.  Synetta Fail MD [facility]  How to contact the [facility] Attending or Consulting provider 7A - 7P or covering provider during after hours 7P -7A, for this patient?   1. Check the care team in [facility] and look for a) attending/consulting [facility] provider listed and b) the  [facility] team listed 2. Log into [email or web address] and use [place]'s universal password to access. If you do not have the password, please contact the hospital operator. 3. Locate the [facility] provider you are looking for under [facility] and page to a number that you can be directly reached. 4. If you still have difficulty reaching the provider, please page the [facility] (Director on Call) for the Hospitalists listed on amion for assistance.  06/18/2020, 10:36 PM

## 2020-06-19 DIAGNOSIS — Z20822 Contact with and (suspected) exposure to covid-19: Secondary | ICD-10-CM | POA: Diagnosis present

## 2020-06-19 DIAGNOSIS — G9340 Encephalopathy, unspecified: Secondary | ICD-10-CM | POA: Diagnosis not present

## 2020-06-19 DIAGNOSIS — J9811 Atelectasis: Secondary | ICD-10-CM | POA: Diagnosis present

## 2020-06-19 DIAGNOSIS — R32 Unspecified urinary incontinence: Secondary | ICD-10-CM | POA: Diagnosis present

## 2020-06-19 DIAGNOSIS — Z7982 Long term (current) use of aspirin: Secondary | ICD-10-CM | POA: Diagnosis not present

## 2020-06-19 DIAGNOSIS — F05 Delirium due to known physiological condition: Secondary | ICD-10-CM | POA: Diagnosis present

## 2020-06-19 DIAGNOSIS — N390 Urinary tract infection, site not specified: Secondary | ICD-10-CM | POA: Diagnosis present

## 2020-06-19 DIAGNOSIS — R531 Weakness: Secondary | ICD-10-CM | POA: Diagnosis present

## 2020-06-19 DIAGNOSIS — G9349 Other encephalopathy: Secondary | ICD-10-CM | POA: Diagnosis present

## 2020-06-19 DIAGNOSIS — R778 Other specified abnormalities of plasma proteins: Secondary | ICD-10-CM | POA: Diagnosis present

## 2020-06-19 DIAGNOSIS — Z87891 Personal history of nicotine dependence: Secondary | ICD-10-CM | POA: Diagnosis not present

## 2020-06-19 DIAGNOSIS — E860 Dehydration: Secondary | ICD-10-CM | POA: Diagnosis present

## 2020-06-19 DIAGNOSIS — E876 Hypokalemia: Secondary | ICD-10-CM | POA: Diagnosis present

## 2020-06-19 DIAGNOSIS — Z8673 Personal history of transient ischemic attack (TIA), and cerebral infarction without residual deficits: Secondary | ICD-10-CM | POA: Diagnosis not present

## 2020-06-19 DIAGNOSIS — N179 Acute kidney failure, unspecified: Secondary | ICD-10-CM | POA: Diagnosis present

## 2020-06-19 DIAGNOSIS — I10 Essential (primary) hypertension: Secondary | ICD-10-CM | POA: Diagnosis present

## 2020-06-19 DIAGNOSIS — N400 Enlarged prostate without lower urinary tract symptoms: Secondary | ICD-10-CM | POA: Diagnosis present

## 2020-06-19 DIAGNOSIS — Z79899 Other long term (current) drug therapy: Secondary | ICD-10-CM | POA: Diagnosis not present

## 2020-06-19 LAB — BASIC METABOLIC PANEL
Anion gap: 12 (ref 5–15)
BUN: 25 mg/dL — ABNORMAL HIGH (ref 8–23)
CO2: 28 mmol/L (ref 22–32)
Calcium: 8.8 mg/dL — ABNORMAL LOW (ref 8.9–10.3)
Chloride: 99 mmol/L (ref 98–111)
Creatinine, Ser: 1.48 mg/dL — ABNORMAL HIGH (ref 0.61–1.24)
GFR, Estimated: 46 mL/min — ABNORMAL LOW (ref >60)
Glucose, Bld: 96 mg/dL (ref 70–99)
Potassium: 3.1 mmol/L — ABNORMAL LOW (ref 3.5–5.1)
Sodium: 139 mmol/L (ref 135–145)

## 2020-06-19 LAB — CBC
HCT: 43.5 % (ref 39.0–52.0)
Hemoglobin: 13.4 g/dL (ref 13.0–17.0)
MCH: 28.3 pg (ref 26.0–34.0)
MCHC: 30.8 g/dL (ref 30.0–36.0)
MCV: 92 fL (ref 80.0–100.0)
Platelets: 190 10*3/uL (ref 150–400)
RBC: 4.73 MIL/uL (ref 4.22–5.81)
RDW: 14.5 % (ref 11.5–15.5)
WBC: 8.8 10*3/uL (ref 4.0–10.5)
nRBC: 0 % (ref 0.0–0.2)

## 2020-06-19 MED ORDER — POTASSIUM CHLORIDE CRYS ER 20 MEQ PO TBCR
30.0000 meq | EXTENDED_RELEASE_TABLET | ORAL | Status: AC
  Administered 2020-06-19 (×2): 30 meq via ORAL
  Filled 2020-06-19 (×2): qty 1

## 2020-06-19 MED ORDER — LOSARTAN POTASSIUM 25 MG PO TABS
25.0000 mg | ORAL_TABLET | Freq: Every day | ORAL | Status: DC
  Administered 2020-06-19 – 2020-06-22 (×4): 25 mg via ORAL
  Filled 2020-06-19 (×4): qty 1

## 2020-06-19 MED ORDER — ATORVASTATIN CALCIUM 10 MG PO TABS
10.0000 mg | ORAL_TABLET | Freq: Every day | ORAL | Status: DC
  Administered 2020-06-19 – 2020-06-22 (×4): 10 mg via ORAL
  Filled 2020-06-19 (×4): qty 1

## 2020-06-19 NOTE — ED Notes (Signed)
Patient cleaned and linens changed. Repositioned and fed lunch. (he ate 1/2 green beans and 1/4 spaghetti. Refused fruit and salad.) Patient resting comfortably. No requests at this time.

## 2020-06-19 NOTE — Progress Notes (Addendum)
PROGRESS NOTE    Brian Wiley  SWF:[id number] DOB: 1933/04/14 DOA: 06/18/2020 PCP: Patient, [name]   Brief Narrative: Taken from H&P. Brian Wiley is a 86 y.o. male with medical history significant of CVA in 1994, hypertension, BPH, arthritis who presents with progressive encephalopathy.  Patient unable to significantly participate in history given his encephalopathy, most history obtained via chart review and with assistance of family.  Patient and his wife moved to their daughter's house from [facility] about 1 month ago.  Since arriving, his daughter has noticed him getting progressively weak and this accelerated some 2 weeks ago and he became more lethargic and began eating less and sleeping more.  Patient was brought to the emergency department on 1021 for evaluation of this patient was determined to be dehydrated and he improved after fluids.  Given his history of stroke he was reevaluated with imaging including CT head and MRI which were negative for repeat infarct or bleed.  Patient was discharged home from the ED at that time. Daughter states that since discharge he has continued to have progressive weakness and encephalopathy.  He is now alert and oriented to person only.  He continues to have a decreased appetite and sleeps most of the day.  He has had some episodes of incontinence, increased urinary frequency.  UA look infected, urine cultures pending and he was started on ceftriaxone.  Subjective: Patient appears confused and agitated at times.  Oriented to self and was able to tell me that he is in [place].  Cannot tell me why he is here in the hospital.  Per nursing staff at times becoming agitated and trying to remove his lines and couple of times trying to get out of bed. Unable to explain any symptoms. He appears little tired and drowsy when seen during morning rounds. Talked with his daughter on phone as patient was unable to provide much significant history,  according to her he was more alert and able to communicate and do his ADLs at baseline.  They have noticed some cognitive decline over the few years but able to manage things with some help from wife.  According to her he appears  more confused, staying in bed most of the day and refusing meals for the past week or so, now requiring 2 people to assist with ADLs.  Assessment & Plan:   Principal Problem:   Encephalopathy Active Problems:   AKI (acute kidney injury) (HCC)   Hypokalemia   Elevated troponin  Encephalopathy/UTI/generalized weakness.  Patient with progressive decline over the past few years per daughter.  Recently moved from [place] to live closer to her so she can help them.  According to daughter he seems more confused than his baseline, not interested in eating or drinking, staying in bed most of the time and becomes agitated easily.  No prior diagnosis of dementia but symptoms are concerning for it.  They were unable to take care of him at home as he was requiring 2 people to help with ADLs.  Lives with his wife who is struggling with breast cancer. UA look infected, no leukocytosis or fever.  Has increased urinary frequency and incontinence which is new.  He was started on ceftriaxone and urine cultures are pending. Patient remained encephalopathic, oriented to self and state in the name of place only.  Unable to tell me that he is in hospital and why he is here.  Did not answer any other questions. PT evaluated him-recommending SNF placement. -  Continue with ceftriaxone-we will de-escalate once culture results are available. -Patient will need outpatient evaluation for family concern of cognitive decline as he does not has any proper diagnosis of dementia but history points to it.   AKI.  Potassium at 1.48, it was 1.15 during recent ED visit.  No prior records as patient recently moved from [place]. -Continue with gentle hydration. -Avoid nephrotoxins. -Monitor renal  function  Hypokalemia.  Magnesium at 2.3.  Potassium of 3.1 -Replete potassium and monitor.  Elevated troponin.  Borderline elevation of troponin with a flat curve, 23>>21. No chest pain or EKG changes.  Most likely secondary to demand.  Hypertension.  Blood pressure elevated.  Patient was on HCTZ at home which are being held secondary to AKI and dehydration. -Start him on losartan 25 mg daily. -As needed hydralazine. -Continue to monitor. -Continue with carvedilol.  History of stroke.  Patient has an history of stroke in 1994.  No acute concern.  CT scan done during recent ED visit was without any acute changes. -Continue home dose of statin  BPH.  Check bladder scan to make sure that patient is not retaining. -Continue with home dose of Flomax and finasteride.  Objective: Vitals:   06/19/20 0717 06/19/20 1022 06/19/20 1100 06/19/20 1145  BP: (!) 159/98 (!) 150/89 (!) 145/131 (!) 117/42  Pulse: 70 71 65 65  Resp: Temp: 97.8 F (36.6 C)     TempSrc: Oral     SpO2: 97% 91% 94% 92%   No intake or output data in the 24 hours ending 06/19/20 1239 There were no vitals filed for this visit.  Examination:  General exam: Well-developed elderly man, appears calm and comfortable  Respiratory system: Clear to auscultation. Respiratory effort normal. Cardiovascular system: S1 & S2 heard, RRR.  Gastrointestinal system: Soft, nontender, nondistended, bowel sounds positive. Central nervous system: Alert and oriented to self only. No focal neurological deficits. Extremities: No edema, no cyanosis, pulses intact and symmetrical. Skin: No rashes, lesions or ulcers Psychiatry: Judgement and insight appear impaired.   DVT prophylaxis: Lovenox Code Status: Full Family Communication: Discussed with daughter on phone. Disposition Plan:  Status is: Inpatient  Remains inpatient appropriate because:Inpatient level of care appropriate due to severity of illness   Dispo: The  patient is from: Home              Anticipated d/c is to: SNF              Anticipated d/c date is: 2 days              Patient currently is not medically stable to d/c.  New Consultants:   None  Procedures:  Antimicrobials:  Ceftriaxone  Data Reviewed: I have personally reviewed following labs and imaging studies  CBC: Recent Labs  Lab 06/13/20 1448 06/18/20 1844 06/19/20 0448  WBC 6.1 9.8 8.8  NEUTROABS 4.3 6.2  --   HGB 13.0 13.4 13.4  HCT 43.5 44.3 43.5  MCV 92.9 91.2 92.0  PLT 155 182 190   Basic Metabolic Panel: Recent Labs  Lab 06/13/20 1448 06/18/20 1844 06/18/20 2050 06/19/20 0448  NA 137 138  --  139  K 4.2 3.1*  --  3.1*  CL 99 97*  --  99  CO2 24 31  --  28  GLUCOSE 108* 140*  --  96  BUN 11 28*  --  25*  CREATININE 1.15 1.70*  --  1.48*  CALCIUM 8.9 8.9  --  8.8*  MG  --   --  2.3  --    GFR: CrCl cannot be calculated (Unknown ideal weight.). Liver Function Tests: Recent Labs  Lab 06/13/20 1448 06/18/20 1844  AST 22 20  ALT 13 14  ALKPHOS 38 41  BILITOT 1.2 0.8  PROT 6.2* 6.5  ALBUMIN 3.0* 3.1*   No results for input(s): LIPASE, AMYLASE in the last 168 hours. Recent Labs  Lab 06/18/20 1844  AMMONIA 35   Coagulation Profile: Recent Labs  Lab 06/13/20 1448  INR 1.0   Cardiac Enzymes: No results for input(s): CKTOTAL, CKMB, CKMBINDEX, TROPONINI in the last 168 hours. BNP (last 3 results) No results for input(s): PROBNP in the last 8760 hours. HbA1C: No results for input(s): HGBA1C in the last 72 hours. CBG: Recent Labs  Lab 06/13/20 1543  GLUCAP 101*   Lipid Profile: No results for input(s): CHOL, HDL, LDLCALC, TRIG, CHOLHDL, LDLDIRECT in the last 72 hours. Thyroid Function Tests: No results for input(s): TSH, T4TOTAL, FREET4, T3FREE, THYROIDAB in the last 72 hours. Anemia Panel: No results for input(s): VITAMINB12, FOLATE, FERRITIN, TIBC, IRON, RETICCTPCT in the last 72 hours. Sepsis Labs: Recent Labs  Lab  06/13/20 1448  LATICACIDVEN 1.9    Recent Results (from the past 240 hour(s))  Respiratory Panel by RT PCR (Flu A&B, Covid) - Nasopharyngeal Swab     Status: None   Collection Time: 06/13/20  3:13 PM   Specimen: Nasopharyngeal Swab  Result Value Ref Range Status   SARS Coronavirus 2 by RT PCR NEGATIVE NEGATIVE Final    Comment: (NOTE) SARS-CoV-2 target nucleic acids are NOT DETECTED.  The SARS-CoV-2 RNA is generally detectable in upper respiratoy specimens during the acute phase of infection. The lowest concentration of SARS-CoV-2 viral copies this assay can detect is 131 copies/mL. A negative result does not preclude SARS-Cov-2 infection and should not be used as the sole basis for treatment or other patient management decisions. A negative result may occur with  improper specimen collection/handling, submission of specimen other than nasopharyngeal swab, presence of viral mutation(s) within the areas targeted by this assay, and inadequate number of viral copies (<131 copies/mL). A negative result must be combined with clinical observations, patient history, and epidemiological information. The expected result is Negative.  Fact Sheet for Patients:  https://www.moore.com/https://www.fda.gov/media/142436/download  Fact Sheet for Healthcare Providers:  https://www.young.biz/https://www.fda.gov/media/142435/download  This test is no t yet approved or cleared by the [place] FDA and  has been authorized for detection and/or diagnosis of SARS-CoV-2 by FDA under an Emergency Use Authorization (EUA). This EUA will remain  in effect (meaning this test can be used) for the duration of the COVID-19 declaration under Section 564(b)(1) of the Act, 21 U.S.C. section 360bbb-3(b)(1), unless the authorization is terminated or revoked sooner.     Influenza A by PCR NEGATIVE NEGATIVE Final   Influenza B by PCR NEGATIVE NEGATIVE Final    Comment: (NOTE) The Xpert Xpress SARS-CoV-2/FLU/RSV assay is intended as an aid in  the  diagnosis of influenza from Nasopharyngeal swab specimens and  should not be used as a sole basis for treatment. Nasal washings and  aspirates are unacceptable for Xpert Xpress SARS-CoV-2/FLU/RSV  testing.  Fact Sheet for Patients: https://www.moore.com/https://www.fda.gov/media/142436/download  Fact Sheet for Healthcare Providers: https://www.young.biz/https://www.fda.gov/media/142435/download  This test is not yet approved or cleared by the [place] FDA and  has been authorized for detection and/or diagnosis of SARS-CoV-2 by  FDA under an Emergency Use Authorization (EUA). This EUA will remain  in effect (meaning this test can be used) for the duration of the  Covid-19 declaration under Section 564(b)(1) of the Act, 21  U.S.C. section 360bbb-3(b)(1), unless the authorization is  terminated or revoked. Performed at [facility], [address] [place], [place] [address]   Respiratory Panel by RT PCR (Flu A&B, Covid) - Nasopharyngeal Swab     Status: None   Collection Time: 06/18/20  5:52 PM   Specimen: Nasopharyngeal Swab  Result Value Ref Range Status   SARS Coronavirus 2 by RT PCR NEGATIVE NEGATIVE Final    Comment: (NOTE) SARS-CoV-2 target nucleic acids are NOT DETECTED.  The SARS-CoV-2 RNA is generally detectable in upper respiratoy specimens during the acute phase of infection. The lowest concentration of SARS-CoV-2 viral copies this assay can detect is 131 copies/mL. A negative result does not preclude SARS-Cov-2 infection and should not be used as the sole basis for treatment or other patient management decisions. A negative result may occur with  improper specimen collection/handling, submission of specimen other than nasopharyngeal swab, presence of viral mutation(s) within the areas targeted by this assay, and inadequate number of viral copies (<131 copies/mL). A negative result must be combined with clinical observations, patient history, and epidemiological information. The expected result is  Negative.  Fact Sheet for Patients:  https://www.moore.com/  Fact Sheet for Healthcare Providers:  https://www.young.biz/  This test is no t yet approved or cleared by the [place] FDA and  has been authorized for detection and/or diagnosis of SARS-CoV-2 by FDA under an Emergency Use Authorization (EUA). This EUA will remain  in effect (meaning this test can be used) for the duration of the COVID-19 declaration under Section 564(b)(1) of the Act, 21 U.S.C. section 360bbb-3(b)(1), unless the authorization is terminated or revoked sooner.     Influenza A by PCR NEGATIVE NEGATIVE Final   Influenza B by PCR NEGATIVE NEGATIVE Final    Comment: (NOTE) The Xpert Xpress SARS-CoV-2/FLU/RSV assay is intended as an aid in  the diagnosis of influenza from Nasopharyngeal swab specimens and  should not be used as a sole basis for treatment. Nasal washings and  aspirates are unacceptable for Xpert Xpress SARS-CoV-2/FLU/RSV  testing.  Fact Sheet for Patients: https://www.moore.com/  Fact Sheet for Healthcare Providers: https://www.young.biz/  This test is not yet approved or cleared by the [place] FDA and  has been authorized for detection and/or diagnosis of SARS-CoV-2 by  FDA under an Emergency Use Authorization (EUA). This EUA will remain  in effect (meaning this test can be used) for the duration of the  Covid-19 declaration under Section 564(b)(1) of the Act, 21  U.S.C. section 360bbb-3(b)(1), unless the authorization is  terminated or revoked. Performed at [facility], [address], [place], [place] [address]      Radiology Studies: DG Chest 2 View  Result Date: 06/18/2020 CLINICAL DATA:  Generalized weakness EXAM: CHEST - 2 VIEW COMPARISON:  Radiograph 06/13/2020 FINDINGS: Chronic elevation of the right hemidiaphragm with some bandlike opacity in the right mid lung compatible with  subsegmental atelectasis or scarring. Some additional hazy opacity in the lung bases and in particular the right basilar periphery likely reflecting further atelectatic change though early airspace disease is difficult to fully exclude. Stable cardiomediastinal contours with a calcified aorta. No pneumothorax. No visible effusion. No acute osseous or soft tissue abnormality. Telemetry leads overlie the chest. IMPRESSION: Chronic elevation of the right hemidiaphragm with likely adjacent subsegmental atelectasis or scarring. Additional hazy basilar opacity  particularly within the right lung base likely reflecting further atelectatic change though early airspace disease is difficult to fully exclude. Aortic Atherosclerosis (ICD10-I70.0). Electronically Signed   By: Kreg Shropshire M.D.   On: 06/18/2020 18:51   DG Abd Portable 1V  Result Date: 06/18/2020 CLINICAL DATA:  Altered mental status weakness EXAM: PORTABLE ABDOMEN - 1 VIEW COMPARISON:  None. FINDINGS: The bowel gas pattern is normal. No radio-opaque calculi or other significant radiographic abnormality are seen. Phleboliths in the pelvis IMPRESSION: Negative. Electronically Signed   By: Jasmine Pang M.D.   On: 06/18/2020 23:26    Scheduled Meds: . aspirin EC  81 mg Oral Daily  . carvedilol  6.25 mg Oral BID WC  . enoxaparin (LOVENOX) injection  40 mg Subcutaneous Q24H  . finasteride  5 mg Oral QHS  . potassium chloride  30 mEq Oral Q4H  . sodium chloride flush  3 mL Intravenous Q12H  . tamsulosin  0.4 mg Oral QHS   Continuous Infusions: . sodium chloride 125 mL/hr at 06/19/20 0015  . cefTRIAXone (ROCEPHIN)  IV Stopped (06/19/20 0014)     LOS: 0 days   Time spent: 35 minutes.  Arnetha Courser, MD [facility]  If 7PM-7AM, please contact night-coverage [email or web address]  06/19/2020, 12:39 PM   This record has been created using Conservation officer, historic buildings. Errors have been sought and corrected,but may not always be located.  Such creation errors do not reflect on the standard of care.

## 2020-06-19 NOTE — Progress Notes (Signed)
CSW completed PASSR screening for patient - #[id number].  Edwin Dada, MSW, LCSW-A Transitions of Care  Clinical Social Worker  [facility] Emergency Departments  Medical ICU [phone]

## 2020-06-19 NOTE — Progress Notes (Signed)
06/19/20 1249  PT Visit Information  Last PT Received On 06/19/20  Assistance Needed +2  PT/OT/SLP Co-Evaluation/Treatment Yes  Reason for Co-Treatment Complexity of the patient's impairments (multi-system involvement);Necessary to address cognition/behavior during functional activity;To address functional/ADL transfers;For patient/therapist safety  PT goals addressed during session Mobility/safety with mobility;Balance  History of Present Illness 86 y.o male presenting with progressive weakness and confusion likely from UTI. He had recently moved from [place] to daughter's home ~1 month prior with wife. PMH includes CVA in 1994, hypertension, BPH, arthritis.  Precautions  Precautions Fall  Restrictions  Weight Bearing Restrictions No  Home Living  Family/patient expects to be discharged to: Private residence  Living Arrangements Children;Spouse/significant other  Additional Comments Unsure of home set up other than chart review information due to pt being poor historian. Per chart pt had just moved from [place] to daughters home with wife.  Prior Function  Comments Unsure of baseline due to cognitive status. 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 an Art gallery manager and occasionally a walker  Communication  Communication Expressive difficulties  Pain Assessment  Pain Assessment No/denies pain  Cognition  Arousal/Alertness Awake/alert  Behavior During Therapy Restless  Overall Cognitive Status Impaired/Different from baseline  Area of Impairment Orientation;Attention;Memory;Following commands;Safety/judgement;Awareness;Problem solving  Orientation Level Disoriented to;Time;Situation (able to state he is in the hospital but required max cues )  Current Attention Level Focused  Memory Decreased short-term memory  Following Commands Follows one step commands inconsistently;Follows one step commands with increased time  Safety/Judgement Decreased awareness of safety;Decreased awareness of deficits  Awareness  Intellectual  Problem Solving Slow processing;Decreased initiation;Difficulty sequencing;Requires verbal cues;Requires tactile cues  General Comments pt requires max cues to maintain attention and complete basic mobility tasks. Pt not consistently following commands, frequently stating "yep" to everything that is asked of him. Unreliable historian about PLOF. When asked to stand and take steps, pt unable to follow motor commands.  Upper Extremity Assessment  Upper Extremity Assessment Defer to OT evaluation  Lower Extremity Assessment  Lower Extremity Assessment Generalized weakness;LLE deficits/detail  LLE Deficits / Details Pt unable to achieve full knee extension  Cervical / Trunk Assessment  Cervical / Trunk Assessment Kyphotic  Bed Mobility  Overal bed mobility Needs Assistance  Bed Mobility Supine to Sit;Sit to Supine  Supine to sit Max assist;+2 for physical assistance;+2 for safety/equipment  Sit to supine Max assist;+2 for physical assistance;+2 for safety/equipment  General bed mobility comments multimodal cues needed to sequence transfer, as well as assist to pull up into upright sitting. Pt attempting to grip therapists hand and areas on mattress that limit further mobiltiy progression.  Transfers  Overall transfer level Needs assistance  Equipment used 2 person hand held assist  Transfers Sit to/from Stand  Sit to Stand Mod assist;+2 physical assistance;+2 safety/equipment  General transfer comment assist to rise and steady, poor ability to follow commands when asked to side step up to [facility]. Pt kept sitting back down and not advancing steps. stood X3 during session.   Balance  Overall balance assessment Needs assistance  Sitting-balance support Bilateral upper extremity supported;Feet unsupported  Sitting balance-Leahy Scale Poor  Sitting balance - Comments intermittent min A to maintain sitting balance  Standing balance support Bilateral upper extremity supported;During  functional activity  Standing balance-Leahy Scale Poor  Standing balance comment reliant on external support  PT - End of Session  Equipment Utilized During Treatment Gait belt  Activity Tolerance Patient tolerated treatment well  Patient left in bed;with call bell/phone within reach (on  stretcher in ED )  Nurse Communication Mobility status  PT Assessment  PT Recommendation/Assessment Patient needs continued PT services  PT Visit Diagnosis Unsteadiness on feet (R26.81);Muscle weakness (generalized) (M62.81);Difficulty in walking, not elsewhere classified (R26.2)  PT Problem List Decreased strength;Decreased balance;Decreased mobility;Decreased cognition;Decreased safety awareness;Decreased knowledge of precautions;Decreased knowledge of use of DME  PT Plan  PT Frequency (ACUTE ONLY) Min 3X/week  PT Treatment/Interventions (ACUTE ONLY) Gait training;DME instruction;Functional mobility training;Therapeutic activities;Therapeutic exercise;Balance training;Patient/family education;Cognitive remediation  AM-PAC PT "6 Clicks" Mobility Outcome Measure (Version 2)  Help needed turning from your back to your side while in a flat bed without using bedrails? 1  Help needed moving from lying on your back to sitting on the side of a flat bed without using bedrails? 1  Help needed moving to and from a bed to a chair (including a wheelchair)? 1  Help needed standing up from a chair using your arms (e.g., wheelchair or bedside chair)? 2  Help needed to walk in hospital room? 1  Help needed climbing 3-5 steps with a railing?  1  6 Click Score 7  Consider Recommendation of Discharge To: CIR/SNF/LTACH  PT Recommendation  Follow Up Recommendations SNF;Supervision/Assistance - 24 hour  PT equipment Wheelchair (measurements PT);Wheelchair cushion (measurements PT);Hospital bed;3in1 (PT)  Individuals Consulted  Consulted and Agree with Results and Recommendations Patient unable/family or caregiver not  available  Acute Rehab PT Goals  PT Goal Formulation Patient unable to participate in goal setting  Time For Goal Achievement 07/03/20  Potential to Achieve Goals Fair  PT Time Calculation  PT Start Time (ACUTE ONLY) 0923  PT Stop Time (ACUTE ONLY) 0944  PT Time Calculation (min) (ACUTE ONLY) 21 min  PT General Charges  $$ ACUTE PT VISIT 1 Visit  PT Evaluation  $PT Eval Moderate Complexity 1 Mod   Pt admitted secondary to problem above with deficits above. Pt requiring max A +2 to come to sitting on edge of stretcher and mod A +2 to stand. Attempted to take side steps multiple times, however, pt kept sitting back down. Unsure of baseline or assist at home as pt with cognitive deficits. Feel he will benefit from SNF level therapies at d/c. Will continue to follow acutely.   Farley Ly, PT, DPT  Acute Rehabilitation Services  Pager: [phone] Office: [phone]

## 2020-06-19 NOTE — ED Notes (Addendum)
Patient care assumed at this time. Patient is awake, disoriented, but talking to RN. He is following commands and able to try to roll on his side. All linens changed for large void. Pericare performed. Red irritation noted to the pannus on the right side, skin cleaned thoroughly. Patient positioned w HOB up to his satisfaction and foot of bed elevated. Heels off the mattress and legs/feet cleaned. Clean socks on patient at this time as well. Pulse ox probe moved to new site. Condom cath placed for incontinence and to protect skin from further irritation. Window to room open to nursing station and patient now resting comfortably. Lights are on dimmest setting for safety and comfort.

## 2020-06-19 NOTE — ED Notes (Signed)
Patient assisted to position of comfort. He was encouraged to use the condom cath and after bladder scan and many instructions, was successful. Void= 250 ml. Patient's daughter called and was updated on patient status. Patient now resting comfortably without distress.

## 2020-06-19 NOTE — Evaluation (Signed)
Occupational Therapy Evaluation Patient Details Name: Brian Wiley MRN: [id number] DOB: 03/29/1933 Today's Date: 06/19/2020    History of Present Illness 86 y.o male presenting with progressive weakness and confusion. He had recently moved from [place] to daughter's home ~1 month prior with wife. PMH includes CVA in 1994, hypertension, BPH, arthritis.   Clinical Impression   Pt admitted with above diagnoses, presenting with cognitive deficits and generalized weakness. Unsure of exact PLOF due to pt being poor historian. Noted that he just moved from [place] to [place] with daughter and was having increasing difficulty with mobility and cognition. At time of eval, pt requiring max A +2 for bed mobility and mod A +2 for sit <> stands. Pt with difficulty following and problem solving basic commands. Also noted to be quite disoriented and restless, mostly states "yep" to all questions. Given current status, recommend SNF level of care at d/c. Will continue to follow per POC listed below.    Follow Up Recommendations  SNF;Supervision/Assistance - 24 hour    Equipment Recommendations  Wheelchair (measurements OT);Wheelchair cushion (measurements OT);Hospital bed;3 in 1 bedside commode    Recommendations for Other Services       Precautions / Restrictions Precautions Precautions: Fall Restrictions Weight Bearing Restrictions: No      Mobility Bed Mobility Overal bed mobility: Needs Assistance Bed Mobility: Supine to Sit;Sit to Supine     Supine to sit: Max assist;+2 for physical assistance;+2 for safety/equipment Sit to supine: Max assist;+2 for physical assistance;+2 for safety/equipment   General bed mobility comments: multimodal cues needed to sequence transfer, as well as assist to pull up into upright sitting. Pt attempting to grip therapists hand and areas on mattress that limit further mobiltiy progression.    Transfers Overall transfer level: Needs assistance Equipment used: 2 person  hand held assist Transfers: Sit to/from Stand Sit to Stand: Mod assist;+2 physical assistance;+2 safety/equipment         General transfer comment: assist to rise and steady, poor ability to follow commands when asked to side step up to [facility]. Pt kept sitting back down and not advancing steps    Balance Overall balance assessment: Needs assistance Sitting-balance support: Bilateral upper extremity supported;Feet unsupported Sitting balance-Leahy Scale: Poor Sitting balance - Comments: intermittent min A to maintain sitting balance   Standing balance support: Bilateral upper extremity supported;During functional activity   Standing balance comment: reliant on external support                           ADL either performed or assessed with clinical judgement   ADL Overall ADL's : Needs assistance/impaired Eating/Feeding: Moderate assistance;Maximal assistance;Sitting   Grooming: Moderate assistance;Maximal assistance;Sitting                                 General ADL Comments: Pt is otherwise max A for all ADLs due to cognitive status     Vision Patient Visual Report: No change from baseline Vision Assessment?: No apparent visual deficits Additional Comments: required increased time to visually track items presented to him, but suspect this to be more cognitive involvement     Perception     Praxis      Pertinent Vitals/Pain Pain Assessment: No/denies pain     Hand Dominance     Extremity/Trunk Assessment Upper Extremity Assessment Upper Extremity Assessment: Generalized weakness   Lower Extremity Assessment Lower Extremity Assessment: Generalized  weakness       Communication Communication Communication: Expressive difficulties   Cognition Arousal/Alertness: Awake/alert Behavior During Therapy: Restless Overall Cognitive Status: Impaired/Different from baseline Area of Impairment: Orientation;Attention;Memory;Following  commands;Safety/judgement;Awareness;Problem solving                 Orientation Level: Disoriented to;Time;Situation (able to state he is in the hospital but required max cues to get to this point) Current Attention Level: Focused Memory: Decreased short-term memory Following Commands: Follows one step commands inconsistently;Follows one step commands with increased time Safety/Judgement: Decreased awareness of safety;Decreased awareness of deficits Awareness: Intellectual Problem Solving: Slow processing;Decreased initiation;Difficulty sequencing;Requires verbal cues;Requires tactile cues General Comments: pt requires max cues to maintain attention and complete basic mobility tasks. Pt not consistently following commands, frequently stating "yep" to everything that is asked of him. Unreliable historian about PLOF. When asked to stand and take steps, pt unable to follow motor commands.   General Comments       Exercises     Shoulder Instructions      Home Living Family/patient expects to be discharged to:: Private residence Living Arrangements: Children;Spouse/significant other                               Additional Comments: Unsure of home set up other than chart review information due to pt being poor historian. Per chart pt had just moved from [place] to daughters home with wife.      Prior Functioning/Environment          Comments: Unsure of baseline due to cognitive status. 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 an Art gallery manager and occasionally a walker        OT Problem List: Decreased strength;Decreased knowledge of use of DME or AE;Decreased activity tolerance;Decreased cognition;Impaired balance (sitting and/or standing);Decreased safety awareness      OT Treatment/Interventions: Self-care/ADL training;Therapeutic exercise;Patient/family education;Balance training;Energy conservation;Therapeutic activities;DME and/or AE instruction;Cognitive remediation/compensation     OT Goals(Current goals can be found in the care plan section) Acute Rehab OT Goals OT Goal Formulation: Patient unable to participate in goal setting Time For Goal Achievement: 07/03/20 Potential to Achieve Goals: Good  OT Frequency: Min 2X/week   Barriers to D/C:            Co-evaluation PT/OT/SLP Co-Evaluation/Treatment: Yes Reason for Co-Treatment: Complexity of the patient's impairments (multi-system involvement);Necessary to address cognition/behavior during functional activity;For patient/therapist safety;To address functional/ADL transfers   OT goals addressed during session: ADL's and self-care;Strengthening/ROM      AM-PAC OT "6 Clicks" Daily Activity     Outcome Measure Help from another person eating meals?: A Lot Help from another person taking care of personal grooming?: A Lot Help from another person toileting, which includes using toliet, bedpan, or urinal?: Total Help from another person bathing (including washing, rinsing, drying)?: Total Help from another person to put on and taking off regular upper body clothing?: A Lot Help from another person to put on and taking off regular lower body clothing?: Total 6 Click Score: 9   End of Session Nurse Communication: Mobility status  Activity Tolerance: Patient tolerated treatment well Patient left: in bed;with call bell/phone within reach  OT Visit Diagnosis: Unsteadiness on feet (R26.81);Other abnormalities of gait and mobility (R26.89);Muscle weakness (generalized) (M62.81);Other symptoms and signs involving cognitive function                Time: 2671-2458 OT Time Calculation (min): 20 min Charges:  OT General Charges $  OT Visit: 1 Visit OT Evaluation $OT Eval Moderate Complexity: 1 Mod  Dalphine Handing, MSOT, OTR/L Acute Rehabilitation Services [facility] Office Number: [phone] Pager: [phone]  Dalphine Handing 06/19/2020, 11:30 AM

## 2020-06-20 LAB — BASIC METABOLIC PANEL
Anion gap: 7 (ref 5–15)
BUN: 14 mg/dL (ref 8–23)
CO2: 29 mmol/L (ref 22–32)
Calcium: 8.5 mg/dL — ABNORMAL LOW (ref 8.9–10.3)
Chloride: 103 mmol/L (ref 98–111)
Creatinine, Ser: 1.13 mg/dL (ref 0.61–1.24)
GFR, Estimated: >60 mL/min (ref >60)
Glucose, Bld: 100 mg/dL — ABNORMAL HIGH (ref 70–99)
Potassium: 3.5 mmol/L (ref 3.5–5.1)
Sodium: 139 mmol/L (ref 135–145)

## 2020-06-20 LAB — TSH: TSH: 1.989 u[IU]/mL (ref 0.350–4.500)

## 2020-06-20 NOTE — Progress Notes (Signed)
PROGRESS NOTE    Brian Wiley  RDE:[id number] DOB: [birth year] DOA: 06/18/2020 PCP: Brian Wiley, [name]   Brief Narrative: Taken from H&P. Brian Wiley is a 86 y.o. male with medical history significant of CVA in 1994, hypertension, BPH, arthritis who presents with progressive encephalopathy.  Brian Wiley unable to significantly participate in history given his encephalopathy, most history obtained via chart review and with assistance of family.  Brian Wiley and his wife moved to their daughter's house from [facility] about 1 month ago.  Since arriving, his daughter has noticed him getting progressively weak and this accelerated some 2 weeks ago and he became more lethargic and began eating less and sleeping more.  Brian Wiley was brought to the emergency department on 1021 for evaluation of this Brian Wiley was determined to be dehydrated and he improved after fluids.  Given his history of stroke he was reevaluated with imaging including CT head and MRI which were negative for repeat infarct or bleed.  Brian Wiley was discharged home from the ED at that time. Daughter states that since discharge he has continued to have progressive weakness and encephalopathy.  He is now alert and oriented to person only.  He continues to have a decreased appetite and sleeps most of the day.  He has had some episodes of incontinence, increased urinary frequency.  UA look infected, urine cultures pending and he was started on ceftriaxone.  Subjective: Brian Wiley was feeling little better when seen today.  Able to answer more orientation questions as compared to yesterday.  When discussed about going to SNF to get some rehab he agrees and also stating that it is my bad knees after some accident which is making me more week.  Assessment & Plan:   Principal Problem:   Encephalopathy Active Problems:   AKI (acute kidney injury) (HCC)   Hypokalemia   Elevated troponin  Encephalopathy/UTI/generalized weakness.  Brian Wiley with progressive  decline over the past few years per daughter.  Recently moved from [place] to live closer to her so she can help them.  According to daughter he seems more confused than his baseline, not interested in eating or drinking, staying in bed most of the time and becomes agitated easily.  No prior diagnosis of dementia but symptoms are concerning for it.  They were unable to take care of him at home as he was requiring 2 people to help with ADLs.  Lives with his wife who is struggling with breast cancer. UA look infected, no leukocytosis or fever.  Has increased urinary frequency and incontinence which is new.  He was started on ceftriaxone and urine cultures are pending. Brian Wiley remained encephalopathic, oriented to self and state in the name of place only.  Unable to tell me that he is in hospital and why he is here.  Did not answer any other questions. PT evaluated him-recommending SNF placement. -Continue with ceftriaxone-we will de-escalate once culture results are available. -Brian Wiley will need outpatient evaluation for family concern of cognitive decline as he does not has any proper diagnosis of dementia but history points to it.   AKI.  Resolved.  Creatinine at 1.48 on admission, it was 1.15 during recent ED visit.  No prior records as Brian Wiley recently moved from [place]. -Discontinue IV fluid. -Encourage p.o. hydration -Avoid nephrotoxins. -Monitor renal function  Hypokalemia.  Resolved -Replete potassium as needed and monitor.  Elevated troponin.  Borderline elevation of troponin with a flat curve, 23>>21. No chest pain or EKG changes.  Most likely secondary to demand.  Hypertension.  Blood pressure within goal.  Brian Wiley was on HCTZ at home which are being held secondary to AKI and dehydration.  He was started on losartan yesterday during current hospitalization. -Continue losartan 25 mg daily. -Can add home dose of HCTZ if needed -As needed hydralazine. -Continue to monitor. -Continue  with carvedilol.  History of stroke.  Brian Wiley has an history of stroke in 1994.  No acute concern.  CT scan done during recent ED visit was without any acute changes. -Continue home dose of statin  BPH.  Check bladder scan to make sure that Brian Wiley is not retaining. -Continue with home dose of Flomax and finasteride.  Objective: Vitals:   06/19/20 1230 06/19/20 1400 06/19/20 1505 06/19/20 2044  BP: 116/76 115/73 (!) 142/98 (!) 145/76  Pulse: 60 62 67 65  Resp: 13 16 20 19   Temp:   97.6 F (36.4 C) 97.8 F (36.6 C)  TempSrc:    Oral  SpO2: 93% 93% 97% 96%    Intake/Output Summary (Last 24 hours) at 06/20/2020 1254 Last data filed at 06/20/2020 0851 Gross per 24 hour  Intake --  Output 550 ml  Net -550 ml   There were no vitals filed for this visit.  Examination:  General.  Well-developed gentleman, in no acute distress. Pulmonary.  Lungs clear bilaterally, normal respiratory effort. CV.  Regular rate and rhythm, no JVD, rub or murmur. Abdomen.  Soft, nontender, nondistended, BS positive. CNS.  Alert and oriented x3.  No focal neurologic deficit. Extremities.  No edema, no cyanosis, pulses intact and symmetrical. Psychiatry.  Judgment and insight appears normal.  DVT prophylaxis: Lovenox Code Status: Full Family Communication: Discussed with daughter on phone. Disposition Plan:  Status is: Inpatient  Remains inpatient appropriate because:Inpatient level of care appropriate due to severity of illness   Dispo: The Brian Wiley is from: Home              Anticipated d/c is to: SNF              Anticipated d/c date is: 2 days              Brian Wiley currently is not medically stable to d/c.  Brian Wiley and family agrees to go to SNF.  TOC is working on it  Consultants:   None  Procedures:  Antimicrobials:  Ceftriaxone  Data Reviewed: I have personally reviewed following labs and imaging studies  CBC: Recent Labs  Lab 06/13/20 1448 06/18/20 1844 06/19/20 0448  WBC  6.1 9.8 8.8  NEUTROABS 4.3 6.2  --   HGB 13.0 13.4 13.4  HCT 43.5 44.3 43.5  MCV 92.9 91.2 92.0  PLT 155 182 190   Basic Metabolic Panel: Recent Labs  Lab 06/13/20 1448 06/18/20 1844 06/18/20 2050 06/19/20 0448 06/20/20 0854  NA 137 138  --  139 139  K 4.2 3.1*  --  3.1* 3.5  CL 99 97*  --  99 103  CO2 24 31  --  28 29  GLUCOSE 108* 140*  --  96 100*  BUN 11 28*  --  25* 14  CREATININE 1.15 1.70*  --  1.48* 1.13  CALCIUM 8.9 8.9  --  8.8* 8.5*  MG  --   --  2.3  --   --    GFR: CrCl cannot be calculated (Unknown ideal weight.). Liver Function Tests: Recent Labs  Lab 06/13/20 1448 06/18/20 1844  AST 22 20  ALT 13 14  ALKPHOS 38 41  BILITOT  1.2 0.8  PROT 6.2* 6.5  ALBUMIN 3.0* 3.1*   No results for input(s): LIPASE, AMYLASE in the last 168 hours. Recent Labs  Lab 06/18/20 1844  AMMONIA 35   Coagulation Profile: Recent Labs  Lab 06/13/20 1448  INR 1.0   Cardiac Enzymes: No results for input(s): CKTOTAL, CKMB, CKMBINDEX, TROPONINI in the last 168 hours. BNP (last 3 results) No results for input(s): PROBNP in the last 8760 hours. HbA1C: No results for input(s): HGBA1C in the last 72 hours. CBG: Recent Labs  Lab 06/13/20 1543  GLUCAP 101*   Lipid Profile: No results for input(s): CHOL, HDL, LDLCALC, TRIG, CHOLHDL, LDLDIRECT in the last 72 hours. Thyroid Function Tests: Recent Labs    06/19/20 0448  TSH 1.989   Anemia Panel: No results for input(s): VITAMINB12, FOLATE, FERRITIN, TIBC, IRON, RETICCTPCT in the last 72 hours. Sepsis Labs: Recent Labs  Lab 06/13/20 1448  LATICACIDVEN 1.9    Recent Results (from the past 240 hour(s))  Respiratory Panel by RT PCR (Flu A&B, Covid) - Nasopharyngeal Swab     Status: None   Collection Time: 06/13/20  3:13 PM   Specimen: Nasopharyngeal Swab  Result Value Ref Range Status   SARS Coronavirus 2 by RT PCR NEGATIVE NEGATIVE Final    Comment: (NOTE) SARS-CoV-2 target nucleic acids are NOT  DETECTED.  The SARS-CoV-2 RNA is generally detectable in upper respiratoy specimens during the acute phase of infection. The lowest concentration of SARS-CoV-2 viral copies this assay can detect is 131 copies/mL. A negative result does not preclude SARS-Cov-2 infection and should not be used as the sole basis for treatment or other Brian Wiley management decisions. A negative result may occur with  improper specimen collection/handling, submission of specimen other than nasopharyngeal swab, presence of viral mutation(s) within the areas targeted by this assay, and inadequate number of viral copies (<131 copies/mL). A negative result must be combined with clinical observations, Brian Wiley history, and epidemiological information. The expected result is Negative.  Fact Sheet for Patients:  https://www.moore.com/  Fact Sheet for Healthcare Providers:  https://www.young.biz/  This test is no t yet approved or cleared by the [place] FDA and  has been authorized for detection and/or diagnosis of SARS-CoV-2 by FDA under an Emergency Use Authorization (EUA). This EUA will remain  in effect (meaning this test can be used) for the duration of the COVID-19 declaration under Section 564(b)(1) of the Act, 21 U.S.C. section 360bbb-3(b)(1), unless the authorization is terminated or revoked sooner.     Influenza A by PCR NEGATIVE NEGATIVE Final   Influenza B by PCR NEGATIVE NEGATIVE Final    Comment: (NOTE) The Xpert Xpress SARS-CoV-2/FLU/RSV assay is intended as an aid in  the diagnosis of influenza from Nasopharyngeal swab specimens and  should not be used as a sole basis for treatment. Nasal washings and  aspirates are unacceptable for Xpert Xpress SARS-CoV-2/FLU/RSV  testing.  Fact Sheet for Patients: https://www.moore.com/  Fact Sheet for Healthcare Providers: https://www.young.biz/  This test is not yet  approved or cleared by the [place] FDA and  has been authorized for detection and/or diagnosis of SARS-CoV-2 by  FDA under an Emergency Use Authorization (EUA). This EUA will remain  in effect (meaning this test can be used) for the duration of the  Covid-19 declaration under Section 564(b)(1) of the Act, 21  U.S.C. section 360bbb-3(b)(1), unless the authorization is  terminated or revoked. Performed at [facility], [address], [place], [place] [address]  Respiratory Panel by RT PCR (Flu A&B, Covid) - Nasopharyngeal Swab     Status: None   Collection Time: 06/18/20  5:52 PM   Specimen: Nasopharyngeal Swab  Result Value Ref Range Status   SARS Coronavirus 2 by RT PCR NEGATIVE NEGATIVE Final    Comment: (NOTE) SARS-CoV-2 target nucleic acids are NOT DETECTED.  The SARS-CoV-2 RNA is generally detectable in upper respiratoy specimens during the acute phase of infection. The lowest concentration of SARS-CoV-2 viral copies this assay can detect is 131 copies/mL. A negative result does not preclude SARS-Cov-2 infection and should not be used as the sole basis for treatment or other Brian Wiley management decisions. A negative result may occur with  improper specimen collection/handling, submission of specimen other than nasopharyngeal swab, presence of viral mutation(s) within the areas targeted by this assay, and inadequate number of viral copies (<131 copies/mL). A negative result must be combined with clinical observations, Brian Wiley history, and epidemiological information. The expected result is Negative.  Fact Sheet for Patients:  https://www.moore.com/https://www.fda.gov/media/142436/download  Fact Sheet for Healthcare Providers:  https://www.young.biz/https://www.fda.gov/media/142435/download  This test is no t yet approved or cleared by the [place] FDA and  has been authorized for detection and/or diagnosis of SARS-CoV-2 by FDA under an Emergency Use Authorization (EUA). This EUA will remain  in  effect (meaning this test can be used) for the duration of the COVID-19 declaration under Section 564(b)(1) of the Act, 21 U.S.C. section 360bbb-3(b)(1), unless the authorization is terminated or revoked sooner.     Influenza A by PCR NEGATIVE NEGATIVE Final   Influenza B by PCR NEGATIVE NEGATIVE Final    Comment: (NOTE) The Xpert Xpress SARS-CoV-2/FLU/RSV assay is intended as an aid in  the diagnosis of influenza from Nasopharyngeal swab specimens and  should not be used as a sole basis for treatment. Nasal washings and  aspirates are unacceptable for Xpert Xpress SARS-CoV-2/FLU/RSV  testing.  Fact Sheet for Patients: https://www.moore.com/https://www.fda.gov/media/142436/download  Fact Sheet for Healthcare Providers: https://www.young.biz/https://www.fda.gov/media/142435/download  This test is not yet approved or cleared by the [place] FDA and  has been authorized for detection and/or diagnosis of SARS-CoV-2 by  FDA under an Emergency Use Authorization (EUA). This EUA will remain  in effect (meaning this test can be used) for the duration of the  Covid-19 declaration under Section 564(b)(1) of the Act, 21  U.S.C. section 360bbb-3(b)(1), unless the authorization is  terminated or revoked. Performed at [facility], [address] [place], [place] [address]      Radiology Studies: DG Chest 2 View  Result Date: 06/18/2020 CLINICAL DATA:  Generalized weakness EXAM: CHEST - 2 VIEW COMPARISON:  Radiograph 06/13/2020 FINDINGS: Chronic elevation of the right hemidiaphragm with some bandlike opacity in the right mid lung compatible with subsegmental atelectasis or scarring. Some additional hazy opacity in the lung bases and in particular the right basilar periphery likely reflecting further atelectatic change though early airspace disease is difficult to fully exclude. Stable cardiomediastinal contours with a calcified aorta. No pneumothorax. No visible effusion. No acute osseous or soft tissue abnormality. Telemetry  leads overlie the chest. IMPRESSION: Chronic elevation of the right hemidiaphragm with likely adjacent subsegmental atelectasis or scarring. Additional hazy basilar opacity particularly within the right lung base likely reflecting further atelectatic change though early airspace disease is difficult to fully exclude. Aortic Atherosclerosis (ICD10-I70.0). Electronically Signed   By: Kreg ShropshirePrice  DeHay M.D.   On: 06/18/2020 18:51   DG Abd Portable 1V  Result Date: 06/18/2020 CLINICAL DATA:  Altered mental status weakness EXAM: PORTABLE ABDOMEN - 1 VIEW COMPARISON:  None. FINDINGS: The bowel gas pattern is normal. No radio-opaque calculi or other significant radiographic abnormality are seen. Phleboliths in the pelvis IMPRESSION: Negative. Electronically Signed   By: Jasmine Pang M.D.   On: 06/18/2020 23:26    Scheduled Meds: . aspirin EC  81 mg Oral Daily  . atorvastatin  10 mg Oral Daily  . carvedilol  6.25 mg Oral BID WC  . enoxaparin (LOVENOX) injection  40 mg Subcutaneous Q24H  . finasteride  5 mg Oral QHS  . losartan  25 mg Oral Daily  . sodium chloride flush  3 mL Intravenous Q12H  . tamsulosin  0.4 mg Oral QHS   Continuous Infusions: . sodium chloride 1 mL (06/20/20 0440)  . cefTRIAXone (ROCEPHIN)  IV 1 g (06/19/20 2144)     LOS: 1 day   Time spent: 25 minutes.  Arnetha Courser, MD [facility]  If 7PM-7AM, please contact night-coverage [email or web address]  06/20/2020, 12:54 PM   This record has been created using Conservation officer, historic buildings. Errors have been sought and corrected,but may not always be located. Such creation errors do not reflect on the standard of care.

## 2020-06-20 NOTE — [place] FL2 (Signed)
[place] MEDICAID FL2 LEVEL OF CARE SCREENING TOOL     IDENTIFICATION  Patient Name: Brian Wiley Birthdate: 09/05/1932 Sex: male Admission Date (Current Location): 06/18/2020  [facility] and [place] Number:  Producer, television/film/video and Address:  [facility] [place], [address], [place], [place] [address]      Provider Number: [id number]  Attending Physician Name and Address:  Arnetha Courser, MD  Relative Name and Phone Number:  [name], Daughter   [phone]    Current Level of Care: Hospital Recommended Level of Care: Skilled Nursing Facility Prior Approval Number:    Date Approved/Denied:   PASRR Number: [id number]  Discharge Plan: SNF    Current Diagnoses: Patient Active Problem List   Diagnosis Date Noted  . AKI (acute kidney injury) (HCC) 06/18/2020  . Hypokalemia 06/18/2020  . Elevated troponin 06/18/2020  . Encephalopathy 06/18/2020    Orientation RESPIRATION BLADDER Height & Weight     Self  Normal Incontinent Weight:   Height:     BEHAVIORAL SYMPTOMS/MOOD NEUROLOGICAL BOWEL NUTRITION STATUS      Continent Diet (Heart diet.  See discharge summary)  AMBULATORY STATUS COMMUNICATION OF NEEDS Skin   Total Care Verbally Normal                       Personal Care Assistance Level of Assistance  Bathing, Feeding, Dressing Bathing Assistance: Maximum assistance Feeding assistance: Limited assistance Dressing Assistance: Maximum assistance     Functional Limitations Info  Sight, Hearing, Speech Sight Info: Adequate Hearing Info: Adequate Speech Info: Adequate    SPECIAL CARE FACTORS FREQUENCY  PT (By licensed PT), OT (By licensed OT)     PT Frequency: 5x week OT Frequency: 5x week            Contractures Contractures Info: Not present    Additional Factors Info  Code Status, Allergies Code Status Info: full Allergies Info: NKA           Current Medications (06/20/2020):  This is the current hospital  active medication list Current Facility-Administered Medications  Medication Dose Route Frequency Provider Last Rate Last Admin  . 0.9 %  sodium chloride infusion   Intravenous Continuous Synetta Fail, MD 125 mL/hr at 06/20/20 0440 1 mL at 06/20/20 0440  . acetaminophen (TYLENOL) tablet 650 mg  650 mg Oral Q6H PRN Synetta Fail, MD       Or  . acetaminophen (TYLENOL) suppository 650 mg  650 mg Rectal Q6H PRN Synetta Fail, MD      . aspirin EC tablet 81 mg  81 mg Oral Daily Synetta Fail, MD   81 mg at 06/20/20 0802  . atorvastatin (LIPITOR) tablet 10 mg  10 mg Oral Daily Arnetha Courser, MD   10 mg at 06/20/20 0802  . bisacodyl (DULCOLAX) EC tablet 5 mg  5 mg Oral Daily PRN Synetta Fail, MD      . carvedilol (COREG) tablet 6.25 mg  6.25 mg Oral BID WC Synetta Fail, MD   6.25 mg at 06/20/20 0802  . cefTRIAXone (ROCEPHIN) 1 g in sodium chloride 0.9 % 100 mL IVPB  1 g Intravenous QHS Synetta Fail, MD 200 mL/hr at 06/19/20 2144 1 g at 06/19/20 2144  . enoxaparin (LOVENOX) injection 40 mg  40 mg Subcutaneous Q24H Synetta Fail, MD   40 mg at 06/19/20 2129  . finasteride (PROSCAR) tablet 5 mg  5 mg Oral QHS  Synetta Fail, MD   5 mg at 06/19/20 2128  . losartan (COZAAR) tablet 25 mg  25 mg Oral Daily Arnetha Courser, MD   25 mg at 06/20/20 0802  . polyethylene glycol (MIRALAX / GLYCOLAX) packet 17 g  17 g Oral Daily PRN Synetta Fail, MD      . sodium chloride flush (NS) 0.9 % injection 3 mL  3 mL Intravenous Q12H Synetta Fail, MD   3 mL at 06/20/20 0809  . tamsulosin (FLOMAX) capsule 0.4 mg  0.4 mg Oral QHS Synetta Fail, MD   0.4 mg at 06/19/20 2129     Discharge Medications: Please see discharge summary for a list of discharge medications.  Relevant Imaging Results:  Relevant Lab Results:   Additional Information SSN [id number]  Lorri Frederick, LCSW

## 2020-06-20 NOTE — Plan of Care (Signed)
  Problem: Education: Goal: Knowledge of General Education information will improve Description Including pain rating scale, medication(s)/side effects and non-pharmacologic comfort measures Outcome: Progressing   Problem: Health Behavior/Discharge Planning: Goal: Ability to manage health-related needs will improve Outcome: Progressing   

## 2020-06-20 NOTE — TOC Initial Note (Signed)
Transition of Care [facility]) - Initial/Assessment Note    Patient Details  Name: Brian Wiley MRN: [id number] Date of Birth: 1932/09/28  [facility]) CM/SW Contact:    Lorri Frederick, LCSW Phone Number: 06/20/2020, 11:26 AM  Clinical Narrative:  CSW spoke with pt, received permission to speak with family.  Pt agreeable to recommendation for SNF but unclear as to his comprehension of this.  CSW spoke with step daughter Annice Pih and Amalia Greenhouse, both on the phone together.  Per Annice Pih, pt and Kathie Rhodes are no longer married but continue to live together and Kathie Rhodes is pt next of kin.  Kathie Rhodes and Annice Pih also in agreement with plan for SNF.  Pt is vaccinated.  Pt and Kathie Rhodes new to [place] from [place] in the past month.  Pt has [facility] Medicare that will be active as of 06/24/20, policy #[id number].  Pt currently has walker and wheelchair in the home, no other equipment. Neither of them are coming to the hospital, Kathie Rhodes recovering from illness and they are being careful about covid exposure.  Discussed SNF choice, Annice Pih will look up SNF on Medicare.gov website and review information there.                  Expected Discharge Plan: Skilled Nursing Facility Barriers to Discharge: Continued Medical Work up, SNF Pending bed offer   Patient Goals and CMS Choice   CMS Medicare.gov Compare Post Acute Care list provided to::  (daughter will look up online)    Expected Discharge Plan and Services Expected Discharge Plan: Skilled Nursing Facility     Post Acute Care Choice: Skilled Nursing Facility Living arrangements for the past 2 months: Single Family Home                                      Prior Living Arrangements/Services Living arrangements for the past 2 months: Single Family Home Lives with:: Adult Children, Spouse Patient language and need for interpreter reviewed:: Yes        Need for Family Participation in Patient Care: Yes (Comment) Care giver support  system in place?: Yes (comment) Current home services: Other (comment) (none) Criminal Activity/Legal Involvement Pertinent to Current Situation/Hospitalization: No - Comment as needed  Activities of Daily Living      Permission Sought/Granted Permission sought to share information with : Family Supports, Oceanographer granted to share information with : Yes, Verbal Permission Granted  Share Information with NAME: wife Kathie Rhodes, daughter Annice Pih  Permission granted to share info w AGENCY: SNF        Emotional Assessment Appearance:: Appears stated age Attitude/Demeanor/Rapport: Inconsistent Affect (typically observed): Pleasant Orientation: : Oriented to Self Alcohol / Substance Use: Not Applicable Psych Involvement: No (comment)  Admission diagnosis:  Hypokalemia [E87.6] Encephalopathy [G93.40] Elevated troponin [R77.8] AKI (acute kidney injury) (HCC) [N17.9] Altered mental status, unspecified altered mental status type [R41.82] Patient Active Problem List   Diagnosis Date Noted  . AKI (acute kidney injury) (HCC) 06/18/2020  . Hypokalemia 06/18/2020  . Elevated troponin 06/18/2020  . Encephalopathy 06/18/2020   PCP:  Patient, [name] Pharmacy:   [id number] - [place], [place] - [address] AT [facility] [address] [place] [address] Phone: [phone] Fax: [phone]     Social Determinants of Health (SDOH) Interventions    Readmission Risk Interventions No flowsheet data found.

## 2020-06-21 DIAGNOSIS — I10 Essential (primary) hypertension: Secondary | ICD-10-CM

## 2020-06-21 DIAGNOSIS — G9340 Encephalopathy, unspecified: Secondary | ICD-10-CM | POA: Diagnosis not present

## 2020-06-21 LAB — SARS CORONAVIRUS 2 BY RT PCR (HOSPITAL ORDER, PERFORMED IN ~~LOC~~ HOSPITAL LAB): SARS Coronavirus 2: NEGATIVE

## 2020-06-21 NOTE — Assessment & Plan Note (Signed)
Resolved, can continue to monitor

## 2020-06-21 NOTE — Plan of Care (Signed)

## 2020-06-21 NOTE — TOC Progression Note (Signed)
Transition of Care [facility]) - Progression Note    Patient Details  Name: Brian Wiley MRN: [id number] Date of Birth: 07/28/1933  Transition of Care [facility]) CM/SW Contact  Lorri Frederick, LCSW Phone Number: 06/21/2020, 4:27 PM  Clinical Narrative:  [facility] has agreed to accept pt on Saturday, 10/30.  Pt will be self pay for 2 days, [employer] active on Monday, 11/1.  CSW spoke with step daughter Annice Pih who is in agreement.  Need covid within 24 hours, MD notified.      Expected Discharge Plan: Skilled Nursing Facility Barriers to Discharge: Continued Medical Work up, SNF Pending bed offer  Expected Discharge Plan and Services Expected Discharge Plan: Skilled Nursing Facility     Post Acute Care Choice: Skilled Nursing Facility Living arrangements for the past 2 months: Single Family Home                                       Social Determinants of Health (SDOH) Interventions    Readmission Risk Interventions No flowsheet data found.

## 2020-06-21 NOTE — Assessment & Plan Note (Signed)
Likely d/t UTI, pt has inprved over course of hospitalization, urine culture pending, continuing ceftriaxone and pending placement

## 2020-06-21 NOTE — Progress Notes (Signed)
PROGRESS NOTE    Brian Wiley  ZOX:[id number] DOB: 1933/03/01 DOA: 06/18/2020 PCP: Patient, [name] Outpatient Specialists:    Brief Narrative:   LOS 2 days Presented to ED with  Chief Complaint  Patient presents with  . Weakness    Brief Narrative: Brian Wiley a 86 y.o.malewith medical history significant ofCVA in 1994, hypertension, BPH, arthritis who presents with progressive encephalopathy. initially, history obtained via chart review and with assistance of family. Patient and his wife moved to their daughter's house from [facility] about 1 month ago. Since arriving, his daughter has noticed him getting progressively weak and this accelerated some 2 weeks ago and he became more lethargic and began eating less and sleeping more. Patient was brought to the emergency department on 10/21 for evaluation of this patient was determined to be dehydrated and he improved after fluids. Given his history of stroke he was reevaluated with imaging including CT head and MRI which were negative for repeat infarct or bleed. Patient was discharged home from the ED at that time. Daughter stated that after that discharge he continued to have progressive weakness and encephalopathy. as of yesterday, was alert and oriented to person only. He continues to have a decreased appetite and sleeps most of the day. He has had some episodes of incontinence, increased urinary frequency.  UA looked infected, urine cultures pending and he was started on ceftriaxone.    Today 06/21/20: waiting on placement, will repeat labs in AM including UA< something must have been mishandles w/ UCx as no results are back yet. If UA ok will d/c abx.     Assessment & Plan:   Principal Problem:   Encephalopathy Active Problems:   AKI (acute kidney injury) (HCC)   Hypokalemia   Elevated troponin   Essential hypertension   Encephalopathy/UTI/generalized weakness.   According to daughter he seems more  confused than his baseline, gradual decline over a few years. No prior diagnosis of dementia but symptoms are concerning for it.  Family were unable to take care of him at home as he was requiring 2 people to help with ADLs.  Lives with his wife who is struggling with breast cancer. 10/26 UA concerning for UTI, culture results still not done? No leukocytosis or fever.  Has increased urinary frequency and incontinence which is new.  He was started on ceftriaxone Patient remained encephalopathic, oriented to self and state in the name of place only.  Unable to tell me that he is in hospital and why he is here.  Did not answer any other questions. PT evaluated him-recommending SNF placement. -->Continue with ceftriaxone-we will de-escalate once culture results are available, ordered UA for the AM, not sure what happened to culture results, will contact lab  --> Patient will need outpatient evaluation for family concern of cognitive decline as he does not has any proper diagnosis of dementia but history points to it.   AKI.  Resolved. --> follow w/ am labs tomorrow 10/30  Creatinine at 1.48 on admission, it was 1.15 during recent ED visit.  No prior records as patient recently moved from [place]. -Discontinue IV fluid. -Encourage p.o. hydration -Avoid nephrotoxins. -Monitor renal function  Hypokalemia. --> follow w/ AM labs 10/30 -Replete potassium as needed and monitor.  Elevated troponin.  Borderline elevation of troponin with a flat curve, 23>>21. No chest pain or EKG changes.  Most likely secondary to demand.   Hypertension.  Blood pressure within goal.  Patient was on HCTZ at home which  are being held secondary to AKI and dehydration.  He was started on losartan yesterday during current hospitalization. -Continue losartan 25 mg daily. -Can add home dose of HCTZ if needed -As needed hydralazine. -Continue to monitor. -Continue with carvedilol.   History of stroke.  Patient has an  history of stroke in 1994.  No acute concern.  CT scan done during recent ED visit was without any acute changes. -Continue home dose of statin  BPH.  Check bladder scan to make sure that patient is not retaining. -Continue with home dose of Flomax and finasteride.   DVT prophylaxis: Lovenox Code Status: Full Family Communication:  Disposition Plan: SNF Status is: Inpatient    Consultants:   none  Procedures:  none  Antimicrobials:  Anti-infectives (From admission, onward)   Start     Dose/Rate Route Frequency Ordered Stop   06/18/20 2230  cefTRIAXone (ROCEPHIN) 1 g in sodium chloride 0.9 % 100 mL IVPB        1 g 200 mL/hr over 30 Minutes Intravenous Daily at bedtime 06/18/20 2224     06/18/20 2200  cefTRIAXone (ROCEPHIN) 1 g in sodium chloride 0.9 % 100 mL IVPB  Status:  Discontinued        1 g 200 mL/hr over 30 Minutes Intravenous Every 24 hours 06/18/20 2158 06/18/20 2159       Subjective: Patient reports feeling well  Objective: Vitals:   06/20/20 2137 06/21/20 0556 06/21/20 0941 06/21/20 1508  BP: (!) 158/78 (!) 152/71 (!) 154/89 (!) 156/88  Pulse: 61 (!) 58 72 62  Resp: 20 18  15   Temp: 98 F (36.7 C) 97.8 F (36.6 C)  98.2 F (36.8 C)  TempSrc:      SpO2: 97% (!) 87%  96%    Intake/Output Summary (Last 24 hours) at 06/21/2020 1602 Last data filed at 06/20/2020 1853 Gross per 24 hour  Intake 490 ml  Output 450 ml  Net 40 ml   There were no vitals filed for this visit.  Examination:  General exam: Appears calm and comfortable  Respiratory system: Clear to auscultation. Respiratory effort normal. Cardiovascular system: S1 & S2 heard, RRR. No JVD, rubs, gallops or clicks. No pedal edema. Gastrointestinal system: Abdomen is nondistended, soft and nontender. No organomegaly or masses felt. Normal bowel sounds heard. Central nervous system: Alert and oriented. No focal neurological deficits Skin: No rashes, lesions or ulcers Psychiatry:  Judgement and insight appear normal. Mood & affect appropriate.     Data Reviewed: I have personally reviewed following labs and imaging studies  CBC: Recent Labs  Lab 06/18/20 1844 06/19/20 0448  WBC 9.8 8.8  NEUTROABS 6.2  --   HGB 13.4 13.4  HCT 44.3 43.5  MCV 91.2 92.0  PLT 182 190   Basic Metabolic Panel: Recent Labs  Lab 06/18/20 1844 06/18/20 2050 06/19/20 0448 06/20/20 0854  NA 138  --  139 139  K 3.1*  --  3.1* 3.5  CL 97*  --  99 103  CO2 31  --  28 29  GLUCOSE 140*  --  96 100*  BUN 28*  --  25* 14  CREATININE 1.70*  --  1.48* 1.13  CALCIUM 8.9  --  8.8* 8.5*  MG  --  2.3  --   --    GFR: CrCl cannot be calculated (Unknown ideal weight.). Liver Function Tests: Recent Labs  Lab 06/18/20 1844  AST 20  ALT 14  ALKPHOS 41  BILITOT 0.8  PROT 6.5  ALBUMIN 3.1*   No results for input(s): LIPASE, AMYLASE in the last 168 hours. Recent Labs  Lab 06/18/20 1844  AMMONIA 35   Coagulation Profile: No results for input(s): INR, PROTIME in the last 168 hours. Cardiac Enzymes: No results for input(s): CKTOTAL, CKMB, CKMBINDEX, TROPONINI in the last 168 hours. BNP (last 3 results) No results for input(s): PROBNP in the last 8760 hours. HbA1C: No results for input(s): HGBA1C in the last 72 hours. CBG: No results for input(s): GLUCAP in the last 168 hours. Lipid Profile: No results for input(s): CHOL, HDL, LDLCALC, TRIG, CHOLHDL, LDLDIRECT in the last 72 hours. Thyroid Function Tests: Recent Labs    06/19/20 0448  TSH 1.989   Anemia Panel: No results for input(s): VITAMINB12, FOLATE, FERRITIN, TIBC, IRON, RETICCTPCT in the last 72 hours. Urine analysis:    Component Value Date/Time   COLORURINE YELLOW 06/18/2020 1933   APPEARANCEUR HAZY (A) 06/18/2020 1933   LABSPEC 1.019 06/18/2020 1933   PHURINE 5.0 06/18/2020 1933   GLUCOSEU NEGATIVE 06/18/2020 1933   HGBUR SMALL (A) 06/18/2020 1933   BILIRUBINUR NEGATIVE 06/18/2020 1933   KETONESUR  NEGATIVE 06/18/2020 1933   PROTEINUR 30 (A) 06/18/2020 1933   NITRITE NEGATIVE 06/18/2020 1933   LEUKOCYTESUR MODERATE (A) 06/18/2020 1933   Sepsis Labs: @LABRCNTIP (procalcitonin:4,lacticidven:4)  Recent Results (from the past 240 hour(s))  Respiratory Panel by RT PCR (Flu A&B, Covid) - Nasopharyngeal Swab     Status: None   Collection Time: 06/13/20  3:13 PM   Specimen: Nasopharyngeal Swab  Result Value Ref Range Status   SARS Coronavirus 2 by RT PCR NEGATIVE NEGATIVE Final    Comment: (NOTE) SARS-CoV-2 target nucleic acids are NOT DETECTED.  The SARS-CoV-2 RNA is generally detectable in upper respiratoy specimens during the acute phase of infection. The lowest concentration of SARS-CoV-2 viral copies this assay can detect is 131 copies/mL. A negative result does not preclude SARS-Cov-2 infection and should not be used as the sole basis for treatment or other patient management decisions. A negative result may occur with  improper specimen collection/handling, submission of specimen other than nasopharyngeal swab, presence of viral mutation(s) within the areas targeted by this assay, and inadequate number of viral copies (<131 copies/mL). A negative result must be combined with clinical observations, patient history, and epidemiological information. The expected result is Negative.  Fact Sheet for Patients:  06/15/20  Fact Sheet for Healthcare Providers:  https://www.moore.com/  This test is no t yet approved or cleared by the https://www.young.biz/ FDA and  has been authorized for detection and/or diagnosis of SARS-CoV-2 by FDA under an Emergency Use Authorization (EUA). This EUA will remain  in effect (meaning this test can be used) for the duration of the COVID-19 declaration under Section 564(b)(1) of the Act, 21 U.S.C. section 360bbb-3(b)(1), unless the authorization is terminated or revoked sooner.     Influenza A by PCR  NEGATIVE NEGATIVE Final   Influenza B by PCR NEGATIVE NEGATIVE Final    Comment: (NOTE) The Xpert Xpress SARS-CoV-2/FLU/RSV assay is intended as an aid in  the diagnosis of influenza from Nasopharyngeal swab specimens and  should not be used as a sole basis for treatment. Nasal washings and  aspirates are unacceptable for Xpert Xpress SARS-CoV-2/FLU/RSV  testing.  Fact Sheet for Patients: [place]  Fact Sheet for Healthcare Providers: https://www.moore.com/  This test is not yet approved or cleared by the https://www.young.biz/ FDA and  has been authorized for detection and/or diagnosis of SARS-CoV-2  by  FDA under an Emergency Use Authorization (EUA). This EUA will remain  in effect (meaning this test can be used) for the duration of the  Covid-19 declaration under Section 564(b)(1) of the Act, 21  U.S.C. section 360bbb-3(b)(1), unless the authorization is  terminated or revoked. Performed at [facility], [address], [place], [place] [address]   Respiratory Panel by RT PCR (Flu A&B, Covid) - Nasopharyngeal Swab     Status: None   Collection Time: 06/18/20  5:52 PM   Specimen: Nasopharyngeal Swab  Result Value Ref Range Status   SARS Coronavirus 2 by RT PCR NEGATIVE NEGATIVE Final    Comment: (NOTE) SARS-CoV-2 target nucleic acids are NOT DETECTED.  The SARS-CoV-2 RNA is generally detectable in upper respiratoy specimens during the acute phase of infection. The lowest concentration of SARS-CoV-2 viral copies this assay can detect is 131 copies/mL. A negative result does not preclude SARS-Cov-2 infection and should not be used as the sole basis for treatment or other patient management decisions. A negative result may occur with  improper specimen collection/handling, submission of specimen other than nasopharyngeal swab, presence of viral mutation(s) within the areas targeted by this assay, and inadequate number of viral  copies (<131 copies/mL). A negative result must be combined with clinical observations, patient history, and epidemiological information. The expected result is Negative.  Fact Sheet for Patients:  https://www.moore.com/  Fact Sheet for Healthcare Providers:  https://www.young.biz/  This test is no t yet approved or cleared by the [place] FDA and  has been authorized for detection and/or diagnosis of SARS-CoV-2 by FDA under an Emergency Use Authorization (EUA). This EUA will remain  in effect (meaning this test can be used) for the duration of the COVID-19 declaration under Section 564(b)(1) of the Act, 21 U.S.C. section 360bbb-3(b)(1), unless the authorization is terminated or revoked sooner.     Influenza A by PCR NEGATIVE NEGATIVE Final   Influenza B by PCR NEGATIVE NEGATIVE Final    Comment: (NOTE) The Xpert Xpress SARS-CoV-2/FLU/RSV assay is intended as an aid in  the diagnosis of influenza from Nasopharyngeal swab specimens and  should not be used as a sole basis for treatment. Nasal washings and  aspirates are unacceptable for Xpert Xpress SARS-CoV-2/FLU/RSV  testing.  Fact Sheet for Patients: https://www.moore.com/  Fact Sheet for Healthcare Providers: https://www.young.biz/  This test is not yet approved or cleared by the [place] FDA and  has been authorized for detection and/or diagnosis of SARS-CoV-2 by  FDA under an Emergency Use Authorization (EUA). This EUA will remain  in effect (meaning this test can be used) for the duration of the  Covid-19 declaration under Section 564(b)(1) of the Act, 21  U.S.C. section 360bbb-3(b)(1), unless the authorization is  terminated or revoked. Performed at [facility], [address], [place], [place] [address]          Radiology Studies last 96 hours: DG Chest 2 View  Result Date: 06/18/2020 CLINICAL DATA:  Generalized  weakness EXAM: CHEST - 2 VIEW COMPARISON:  Radiograph 06/13/2020 FINDINGS: Chronic elevation of the right hemidiaphragm with some bandlike opacity in the right mid lung compatible with subsegmental atelectasis or scarring. Some additional hazy opacity in the lung bases and in particular the right basilar periphery likely reflecting further atelectatic change though early airspace disease is difficult to fully exclude. Stable cardiomediastinal contours with a calcified aorta. No pneumothorax. No visible effusion. No acute osseous or soft tissue abnormality. Telemetry leads  overlie the chest. IMPRESSION: Chronic elevation of the right hemidiaphragm with likely adjacent subsegmental atelectasis or scarring. Additional hazy basilar opacity particularly within the right lung base likely reflecting further atelectatic change though early airspace disease is difficult to fully exclude. Aortic Atherosclerosis (ICD10-I70.0). Electronically Signed   By: Kreg Shropshire M.D.   On: 06/18/2020 18:51   DG Abd Portable 1V  Result Date: 06/18/2020 CLINICAL DATA:  Altered mental status weakness EXAM: PORTABLE ABDOMEN - 1 VIEW COMPARISON:  None. FINDINGS: The bowel gas pattern is normal. No radio-opaque calculi or other significant radiographic abnormality are seen. Phleboliths in the pelvis IMPRESSION: Negative. Electronically Signed   By: Jasmine Pang M.D.   On: 06/18/2020 23:26         Scheduled Meds: . aspirin EC  81 mg Oral Daily  . atorvastatin  10 mg Oral Daily  . carvedilol  6.25 mg Oral BID WC  . enoxaparin (LOVENOX) injection  40 mg Subcutaneous Q24H  . finasteride  5 mg Oral QHS  . losartan  25 mg Oral Daily  . sodium chloride flush  3 mL Intravenous Q12H  . tamsulosin  0.4 mg Oral QHS   Continuous Infusions: . cefTRIAXone (ROCEPHIN)  IV 1 g (06/20/20 2330)     LOS: 2 days    Time spent:35 mins    Sunnie Nielsen, DO [facility]  If 7PM-7AM, please contact  night-coverage [email or web address] 06/21/2020, 4:02 PM

## 2020-06-21 NOTE — Assessment & Plan Note (Signed)
Resolved, encourage po intake, we've held home HCTZ may consider restarting

## 2020-06-21 NOTE — Progress Notes (Signed)
error 

## 2020-06-21 NOTE — Plan of Care (Signed)
  Problem: Education: Goal: Knowledge of General Education information will improve Description: Including pain rating scale, medication(s)/side effects and non-pharmacologic comfort measures Outcome: Not Progressing   Problem: Health Behavior/Discharge Planning: Goal: Ability to manage health-related needs will improve Outcome: Not Progressing   Problem: Clinical Measurements: Goal: Ability to maintain clinical measurements within normal limits will improve Outcome: Not Progressing Goal: Will remain free from infection Outcome: Not Progressing Goal: Diagnostic test results will improve Outcome: Not Progressing Goal: Respiratory complications will improve Outcome: Not Progressing Goal: Cardiovascular complication will be avoided Outcome: Not Progressing   Problem: Coping: Goal: Level of anxiety will decrease Outcome: Not Progressing   Problem: Elimination: Goal: Will not experience complications related to bowel motility Outcome: Not Progressing Goal: Will not experience complications related to urinary retention Outcome: Not Progressing   

## 2020-06-22 DIAGNOSIS — G9340 Encephalopathy, unspecified: Secondary | ICD-10-CM | POA: Diagnosis not present

## 2020-06-22 LAB — CBC
HCT: 39 % (ref 39.0–52.0)
Hemoglobin: 12 g/dL — ABNORMAL LOW (ref 13.0–17.0)
MCH: 27.9 pg (ref 26.0–34.0)
MCHC: 30.8 g/dL (ref 30.0–36.0)
MCV: 90.7 fL (ref 80.0–100.0)
Platelets: 179 10*3/uL (ref 150–400)
RBC: 4.3 MIL/uL (ref 4.22–5.81)
RDW: 14.4 % (ref 11.5–15.5)
WBC: 6.8 10*3/uL (ref 4.0–10.5)
nRBC: 0 % (ref 0.0–0.2)

## 2020-06-22 LAB — BASIC METABOLIC PANEL
Anion gap: 7 (ref 5–15)
BUN: 16 mg/dL (ref 8–23)
CO2: 29 mmol/L (ref 22–32)
Calcium: 8.7 mg/dL — ABNORMAL LOW (ref 8.9–10.3)
Chloride: 103 mmol/L (ref 98–111)
Creatinine, Ser: 1.07 mg/dL (ref 0.61–1.24)
GFR, Estimated: >60 mL/min (ref >60)
Glucose, Bld: 103 mg/dL — ABNORMAL HIGH (ref 70–99)
Potassium: 4 mmol/L (ref 3.5–5.1)
Sodium: 139 mmol/L (ref 135–145)

## 2020-06-22 MED ORDER — CEFTRIAXONE SODIUM 1 G IJ SOLR: 1.0000 g | Freq: Every day | INTRAMUSCULAR | 0 refills | Status: AC

## 2020-06-22 MED ORDER — CEFTRIAXONE SODIUM 1 G IJ SOLR
1.0000 g | Freq: Every day | INTRAMUSCULAR | Status: DC
  Filled 2020-06-22: qty 10

## 2020-06-22 MED ORDER — POLYETHYLENE GLYCOL 3350 17 G PO PACK: 17.0000 g | PACK | Freq: Every day | ORAL | 0 refills | Status: AC | PRN

## 2020-06-22 MED ORDER — ACETAMINOPHEN 325 MG PO TABS: 650.0000 mg | ORAL_TABLET | Freq: Four times a day (QID) | ORAL | 0 refills | Status: AC | PRN

## 2020-06-22 MED ORDER — LOSARTAN POTASSIUM 25 MG PO TABS
25.0000 mg | ORAL_TABLET | Freq: Every day | ORAL | 0 refills | Status: DC
Start: 2020-06-22 — End: 2020-08-20

## 2020-06-22 MED ORDER — LOSARTAN POTASSIUM 25 MG PO TABS
25.0000 mg | ORAL_TABLET | Freq: Every day | ORAL | 0 refills | Status: DC
Start: 2020-06-23 — End: 2020-06-22

## 2020-06-22 MED ORDER — LOSARTAN POTASSIUM 25 MG PO TABS: 25.0000 mg | ORAL_TABLET | Freq: Every day | ORAL | Status: DC

## 2020-06-22 NOTE — Plan of Care (Signed)

## 2020-06-22 NOTE — Discharge Summary (Signed)
Physician Discharge Summary  Patient ID: Brian Wiley MRN: [id number] DOB/AGE: [id number] y.o.  Admit date: 06/18/2020 Discharge date: 06/22/2020  Admission Diagnoses: Encephalopathy - concern for UTI as cause.   Discharge Diagnoses:  Principal Problem:   Encephalopathy - resolved, concern for possible underlying dementia  Active Problems:   UTI (presumed) - patient uncooperative with providing urine specimens, keeps urinating in bed, unable to obtain repeat UA/UCx   AKI (acute kidney injury) (HCC) - resolved, Cr 1.07 at time of discharge   Hypokalemia - resolved   Elevated troponin - due to demand   Essential hypertension - held hctz, started losartan for renal protection, will likely need titration up on this Rx     Discharged Condition: good  Hospital Course:  Brian Wiley a 86 y.o.malewith medical history significant ofCVA in 1994, hypertension, BPH, arthritis who presents with progressive encephalopathy. initially, history obtained via chart review and with assistance of family. Patient and his wife moved to their daughter's house from [facility] about 1 month ago. Since arriving, his daughter has noticed him getting progressively weak and this accelerated some 2 weeks ago and he became more lethargic and began eating less and sleeping more. Patient was brought to the emergency department on 10/21 for evaluation of this patient was determined to be dehydrated and he improved after fluids. Given his history of stroke he was reevaluated with imaging including CT head and MRI which were negative for repeat infarct or bleed. Patient was discharged home from the ED at that time. Daughter stated that after that discharge he continued to have progressive weakness and encephalopathy. as of yesterday, was alert and oriented to person only. He continues to have a decreased appetite and sleeps most of the day. He has had some episodes of incontinence, increased urinary  frequency. UA looked infected, patient has been refusing additional urine collections so will send empiric abx for 2 more days      Consults: None  Significant Diagnostic Studies: labs: as below, urine culture specimen lost unable to confirm true UTI   Treatments: IV hydration and antibiotics: ceftriaxone  Discharge Exam: Blood pressure (!) 145/94, pulse 63, temperature 98.4 F (36.9 C), temperature source Oral, resp. rate 20, SpO2 96 %. General appearance: alert, cooperative and appears stated age Head: Normocephalic, without obvious abnormality, atraumatic Resp: clear to auscultation bilaterally Chest wall: no tenderness GI: soft, non-tender; bowel sounds normal; no masses,  no organomegaly Extremities: extremities normal, atraumatic, no cyanosis or edema  Disposition: Discharge disposition: 03-Skilled Nursing Facility       Discharge Instructions    Call MD for:  difficulty breathing, headache or visual disturbances   Complete by: As directed    Call MD for:  persistant dizziness or light-headedness   Complete by: As directed    Call MD for:  redness, tenderness, or signs of infection (pain, swelling, redness, odor or green/yellow discharge around incision site)   Complete by: As directed    Call MD for:  severe uncontrolled pain   Complete by: As directed    Call MD for:  temperature >100.4   Complete by: As directed    Diet - low sodium heart healthy   Complete by: As directed    Increase activity slowly   Complete by: As directed      Allergies as of 06/22/2020   No Known Allergies     Medication List    STOP taking these medications   hydrochlorothiazide 25 MG tablet Commonly known as: HYDRODIURIL  TAKE these medications   acetaminophen 325 MG tablet Commonly known as: TYLENOL Take 2 tablets (650 mg total) by mouth every 6 (six) hours as needed for mild pain (or Fever >/= 101).   aspirin EC 81 MG tablet Take 81 mg by mouth daily. Swallow  whole.   atorvastatin 10 MG tablet Commonly known as: LIPITOR Take 10 mg by mouth daily.   carvedilol 6.25 MG tablet Commonly known as: COREG Take 6.25 mg by mouth 2 (two) times daily with a meal.   cefTRIAXone 1 g injection Commonly known as: ROCEPHIN Inject 1 g into the muscle at bedtime.   finasteride 5 MG tablet Commonly known as: PROSCAR Take 5 mg by mouth at bedtime.   losartan 25 MG tablet Commonly known as: COZAAR Take 1 tablet (25 mg total) by mouth daily. Start taking on: June 23, 2020   polyethylene glycol 17 g packet Commonly known as: MIRALAX / GLYCOLAX Take 17 g by mouth daily as needed for mild constipation.   tamsulosin 0.4 MG Caps capsule Commonly known as: FLOMAX Take 0.4 mg by mouth at bedtime.        Signed: Sunnie Nielsen 06/22/2020, 10:57 AM

## 2020-06-22 NOTE — TOC Transition Note (Signed)
Transition of Care [facility]) - CM/SW Discharge Note   Patient Details  Name: Brian Wiley MRN: [id number] Date of Birth: 03-May-1933  Transition of [facility] CM/SW Contact:  Carmina Miller, LCSWA Phone Number: 06/22/2020, 10:45 AM   Clinical Narrative:    Patient will DC to: [facility] Anticipated DC date: 06/22/20 Family notified: Left vm for daughter Brian Wiley Transport by: Sharin Mons  Per MD patient ready for DC to [facility]. RN to call report prior to discharge [phone], room 107. RN, patient, patient's family, and facility notified of DC. Discharge Summary and FL2 sent to facility. DC packet on chart. Ambulance transport requested for patient.   CSW will sign off for now as social work intervention is no longer needed. Please consult [place] again if new needs arise.  Final next level of care: Skilled Nursing Facility Barriers to Discharge: Barriers Resolved   Patient Goals and CMS Choice Patient states their goals for this hospitalization and ongoing recovery are:: Get better soon CMS Medicare.gov Compare Post Acute Care list provided to:: Patient Represenative (must comment) ([place])    Discharge Placement PASRR number recieved: 06/19/20            Patient chooses bed at: [facility] [place] Patient to be transferred to facility by: [facility] Name of family member notified: Left vm for daughter Brian Wiley Patient and family notified of of transfer: 06/22/20  Discharge Plan and Services     Post Acute Care Choice: Skilled Nursing Facility          DME Arranged: Wheelchair manual, Environmental consultant                    Social Determinants of Health (SDOH) Interventions     Readmission Risk Interventions No flowsheet data found.

## 2020-06-22 NOTE — Progress Notes (Signed)
Report called to facility. Will print out all paperwork, remove tele, and remove IV once orders come through.

## 2020-08-20 ENCOUNTER — Inpatient Hospital Stay (HOSPITAL_COMMUNITY)
Admission: EM | Admit: 2020-08-20 | Discharge: 2020-09-24 | DRG: 871 | Disposition: E | Payer: Medicare Other | Source: Skilled Nursing Facility | Attending: Internal Medicine | Admitting: Internal Medicine

## 2020-08-20 ENCOUNTER — Emergency Department (HOSPITAL_COMMUNITY): Payer: Medicare Other

## 2020-08-20 ENCOUNTER — Encounter (HOSPITAL_COMMUNITY): Payer: Self-pay | Admitting: Emergency Medicine

## 2020-08-20 ENCOUNTER — Other Ambulatory Visit: Payer: Self-pay

## 2020-08-20 DIAGNOSIS — I10 Essential (primary) hypertension: Secondary | ICD-10-CM | POA: Diagnosis present

## 2020-08-20 DIAGNOSIS — N400 Enlarged prostate without lower urinary tract symptoms: Secondary | ICD-10-CM | POA: Diagnosis present

## 2020-08-20 DIAGNOSIS — J9622 Acute and chronic respiratory failure with hypercapnia: Secondary | ICD-10-CM | POA: Diagnosis present

## 2020-08-20 DIAGNOSIS — R778 Other specified abnormalities of plasma proteins: Secondary | ICD-10-CM | POA: Diagnosis present

## 2020-08-20 DIAGNOSIS — R1314 Dysphagia, pharyngoesophageal phase: Secondary | ICD-10-CM | POA: Diagnosis present

## 2020-08-20 DIAGNOSIS — R7989 Other specified abnormal findings of blood chemistry: Secondary | ICD-10-CM | POA: Diagnosis present

## 2020-08-20 DIAGNOSIS — F05 Delirium due to known physiological condition: Secondary | ICD-10-CM | POA: Diagnosis present

## 2020-08-20 DIAGNOSIS — G9349 Other encephalopathy: Secondary | ICD-10-CM | POA: Diagnosis present

## 2020-08-20 DIAGNOSIS — J9601 Acute respiratory failure with hypoxia: Secondary | ICD-10-CM | POA: Diagnosis not present

## 2020-08-20 DIAGNOSIS — R54 Age-related physical debility: Secondary | ICD-10-CM | POA: Diagnosis present

## 2020-08-20 DIAGNOSIS — I351 Nonrheumatic aortic (valve) insufficiency: Secondary | ICD-10-CM | POA: Diagnosis present

## 2020-08-20 DIAGNOSIS — Z79899 Other long term (current) drug therapy: Secondary | ICD-10-CM | POA: Diagnosis not present

## 2020-08-20 DIAGNOSIS — J9621 Acute and chronic respiratory failure with hypoxia: Secondary | ICD-10-CM | POA: Diagnosis present

## 2020-08-20 DIAGNOSIS — D649 Anemia, unspecified: Secondary | ICD-10-CM | POA: Diagnosis present

## 2020-08-20 DIAGNOSIS — A419 Sepsis, unspecified organism: Principal | ICD-10-CM | POA: Diagnosis present

## 2020-08-20 DIAGNOSIS — R609 Edema, unspecified: Secondary | ICD-10-CM | POA: Diagnosis not present

## 2020-08-20 DIAGNOSIS — Z8673 Personal history of transient ischemic attack (TIA), and cerebral infarction without residual deficits: Secondary | ICD-10-CM

## 2020-08-20 DIAGNOSIS — R600 Localized edema: Secondary | ICD-10-CM | POA: Diagnosis present

## 2020-08-20 DIAGNOSIS — Z20822 Contact with and (suspected) exposure to covid-19: Secondary | ICD-10-CM | POA: Diagnosis present

## 2020-08-20 DIAGNOSIS — Z66 Do not resuscitate: Secondary | ICD-10-CM | POA: Diagnosis present

## 2020-08-20 DIAGNOSIS — J9602 Acute respiratory failure with hypercapnia: Secondary | ICD-10-CM | POA: Diagnosis not present

## 2020-08-20 DIAGNOSIS — Z515 Encounter for palliative care: Secondary | ICD-10-CM | POA: Diagnosis not present

## 2020-08-20 DIAGNOSIS — R1319 Other dysphagia: Secondary | ICD-10-CM | POA: Diagnosis not present

## 2020-08-20 DIAGNOSIS — E440 Moderate protein-calorie malnutrition: Secondary | ICD-10-CM | POA: Diagnosis present

## 2020-08-20 DIAGNOSIS — J690 Pneumonitis due to inhalation of food and vomit: Secondary | ICD-10-CM | POA: Diagnosis present

## 2020-08-20 DIAGNOSIS — Z6835 Body mass index (BMI) 35.0-35.9, adult: Secondary | ICD-10-CM | POA: Diagnosis not present

## 2020-08-20 DIAGNOSIS — G9340 Encephalopathy, unspecified: Secondary | ICD-10-CM | POA: Diagnosis present

## 2020-08-20 DIAGNOSIS — E669 Obesity, unspecified: Secondary | ICD-10-CM | POA: Diagnosis present

## 2020-08-20 DIAGNOSIS — J189 Pneumonia, unspecified organism: Secondary | ICD-10-CM | POA: Diagnosis not present

## 2020-08-20 DIAGNOSIS — Z781 Physical restraint status: Secondary | ICD-10-CM | POA: Diagnosis not present

## 2020-08-20 DIAGNOSIS — K863 Pseudocyst of pancreas: Secondary | ICD-10-CM | POA: Diagnosis present

## 2020-08-20 DIAGNOSIS — F0390 Unspecified dementia without behavioral disturbance: Secondary | ICD-10-CM | POA: Diagnosis present

## 2020-08-20 DIAGNOSIS — Z7982 Long term (current) use of aspirin: Secondary | ICD-10-CM | POA: Diagnosis not present

## 2020-08-20 DIAGNOSIS — E46 Unspecified protein-calorie malnutrition: Secondary | ICD-10-CM | POA: Diagnosis present

## 2020-08-20 DIAGNOSIS — Z87891 Personal history of nicotine dependence: Secondary | ICD-10-CM

## 2020-08-20 LAB — URINALYSIS, ROUTINE W REFLEX MICROSCOPIC
Bilirubin Urine: NEGATIVE
Glucose, UA: NEGATIVE mg/dL
Hgb urine dipstick: NEGATIVE
Ketones, ur: NEGATIVE mg/dL
Nitrite: NEGATIVE
Protein, ur: 100 mg/dL — AB
Specific Gravity, Urine: >1.046 — ABNORMAL HIGH (ref 1.005–1.030)
pH: 5 (ref 5.0–8.0)

## 2020-08-20 LAB — CBC
HCT: 43.8 % (ref 39.0–52.0)
Hemoglobin: 12.5 g/dL — ABNORMAL LOW (ref 13.0–17.0)
MCH: 27.6 pg (ref 26.0–34.0)
MCHC: 28.5 g/dL — ABNORMAL LOW (ref 30.0–36.0)
MCV: 96.7 fL (ref 80.0–100.0)
Platelets: 229 10*3/uL (ref 150–400)
RBC: 4.53 MIL/uL (ref 4.22–5.81)
RDW: 14.4 % (ref 11.5–15.5)
WBC: 10.5 10*3/uL (ref 4.0–10.5)
nRBC: 0 % (ref 0.0–0.2)

## 2020-08-20 LAB — COMPREHENSIVE METABOLIC PANEL
ALT: 12 U/L (ref 0–44)
AST: 16 U/L (ref 15–41)
Albumin: 2.7 g/dL — ABNORMAL LOW (ref 3.5–5.0)
Alkaline Phosphatase: 53 U/L (ref 38–126)
Anion gap: 9 (ref 5–15)
BUN: 16 mg/dL (ref 8–23)
CO2: 32 mmol/L (ref 22–32)
Calcium: 8.6 mg/dL — ABNORMAL LOW (ref 8.9–10.3)
Chloride: 100 mmol/L (ref 98–111)
Creatinine, Ser: 1.03 mg/dL (ref 0.61–1.24)
GFR, Estimated: >60 mL/min (ref >60)
Glucose, Bld: 125 mg/dL — ABNORMAL HIGH (ref 70–99)
Potassium: 4.4 mmol/L (ref 3.5–5.1)
Sodium: 141 mmol/L (ref 135–145)
Total Bilirubin: 0.3 mg/dL (ref 0.3–1.2)
Total Protein: 6.8 g/dL (ref 6.5–8.1)

## 2020-08-20 LAB — BLOOD GAS, ARTERIAL
Acid-Base Excess: 7.7 mmol/L — ABNORMAL HIGH (ref 0.0–2.0)
Bicarbonate: 35 mmol/L — ABNORMAL HIGH (ref 20.0–28.0)
FIO2: 100
O2 Saturation: 93 %
Patient temperature: 37
pCO2 arterial: 86.7 mmHg (ref 32.0–48.0)
pH, Arterial: 7.231 — ABNORMAL LOW (ref 7.350–7.450)
pO2, Arterial: 77.4 mmHg — ABNORMAL LOW (ref 83.0–108.0)

## 2020-08-20 LAB — BRAIN NATRIURETIC PEPTIDE: B Natriuretic Peptide: 153.4 pg/mL — ABNORMAL HIGH (ref 0.0–100.0)

## 2020-08-20 LAB — LIPASE, BLOOD: Lipase: 28 U/L (ref 11–51)

## 2020-08-20 LAB — RESP PANEL BY RT-PCR (FLU A&B, COVID) ARPGX2
Influenza A by PCR: NEGATIVE
Influenza B by PCR: NEGATIVE
SARS Coronavirus 2 by RT PCR: NEGATIVE

## 2020-08-20 LAB — HIV ANTIBODY (ROUTINE TESTING W REFLEX): HIV Screen 4th Generation wRfx: NONREACTIVE

## 2020-08-20 LAB — GLUCOSE, CAPILLARY: Glucose-Capillary: 99 mg/dL (ref 70–99)

## 2020-08-20 LAB — LACTIC ACID, PLASMA: Lactic Acid, Venous: 1.4 mmol/L (ref 0.5–1.9)

## 2020-08-20 LAB — TROPONIN I (HIGH SENSITIVITY)
Troponin I (High Sensitivity): 104 ng/L (ref <18)
Troponin I (High Sensitivity): 163 ng/L (ref <18)

## 2020-08-20 LAB — D-DIMER, QUANTITATIVE: D-Dimer, Quant: 3.13 ug/mL-FEU — ABNORMAL HIGH (ref 0.00–0.50)

## 2020-08-20 MED ORDER — SODIUM CHLORIDE 0.9 % IV SOLN: 2.0000 g | INTRAVENOUS | Status: DC

## 2020-08-20 MED ORDER — ALBUTEROL SULFATE HFA 108 (90 BASE) MCG/ACT IN AERS
4.0000 | INHALATION_SPRAY | Freq: Once | RESPIRATORY_TRACT | Status: AC
  Administered 2020-08-20: 14:00:00 4 via RESPIRATORY_TRACT

## 2020-08-20 MED ORDER — SODIUM CHLORIDE 0.9 % IV SOLN: 500.0000 mg | INTRAVENOUS | Status: DC

## 2020-08-20 MED ORDER — CHLORHEXIDINE GLUCONATE 0.12 % MT SOLN
15.0000 mL | Freq: Two times a day (BID) | OROMUCOSAL | Status: DC
  Administered 2020-08-20 – 2020-08-26 (×9): 15 mL via OROMUCOSAL
  Filled 2020-08-20 (×7): qty 15

## 2020-08-20 MED ORDER — ALBUTEROL SULFATE HFA 108 (90 BASE) MCG/ACT IN AERS
2.0000 | INHALATION_SPRAY | RESPIRATORY_TRACT | Status: DC | PRN
  Administered 2020-08-20: 13:00:00 2 via RESPIRATORY_TRACT
  Filled 2020-08-20: qty 6.7

## 2020-08-20 MED ORDER — SODIUM CHLORIDE 0.9 % IV SOLN: INTRAVENOUS | Status: DC

## 2020-08-20 MED ORDER — SODIUM CHLORIDE 0.9 % IV SOLN
1.0000 g | Freq: Once | INTRAVENOUS | Status: AC
  Administered 2020-08-20: 13:00:00 1 g via INTRAVENOUS
  Filled 2020-08-20: qty 10

## 2020-08-20 MED ORDER — SODIUM CHLORIDE 0.9 % IV SOLN
500.0000 mg | Freq: Once | INTRAVENOUS | Status: AC
  Administered 2020-08-20: 12:00:00 500 mg via INTRAVENOUS
  Filled 2020-08-20: qty 500

## 2020-08-20 MED ORDER — SODIUM CHLORIDE 0.9 % IV SOLN
500.0000 mg | INTRAVENOUS | Status: AC
  Administered 2020-08-21 – 2020-08-24 (×4): 500 mg via INTRAVENOUS
  Filled 2020-08-20 (×4): qty 500

## 2020-08-20 MED ORDER — AEROCHAMBER PLUS FLO-VU LARGE MISC
Status: AC
  Administered 2020-08-20: 13:00:00 1
  Filled 2020-08-20: qty 1

## 2020-08-20 MED ORDER — ENOXAPARIN SODIUM 40 MG/0.4ML ~~LOC~~ SOLN
40.0000 mg | SUBCUTANEOUS | Status: DC
  Administered 2020-08-20 – 2020-08-24 (×5): 40 mg via SUBCUTANEOUS
  Filled 2020-08-20 (×6): qty 0.4

## 2020-08-20 MED ORDER — IOHEXOL 350 MG/ML SOLN
75.0000 mL | Freq: Once | INTRAVENOUS | Status: AC | PRN
  Administered 2020-08-20: 75 mL via INTRAVENOUS

## 2020-08-20 MED ORDER — ORAL CARE MOUTH RINSE
15.0000 mL | Freq: Two times a day (BID) | OROMUCOSAL | Status: DC
  Administered 2020-08-21 – 2020-08-26 (×7): 15 mL via OROMUCOSAL

## 2020-08-20 MED ORDER — PIPERACILLIN-TAZOBACTAM 3.375 G IVPB
3.3750 g | Freq: Three times a day (TID) | INTRAVENOUS | Status: DC
  Administered 2020-08-20 – 2020-08-26 (×18): 3.375 g via INTRAVENOUS
  Filled 2020-08-20 (×16): qty 50

## 2020-08-20 NOTE — ED Provider Notes (Addendum)
[facility] EMERGENCY DEPARTMENT Provider Note   CSN: [id number] Arrival date & time: 07/30/2020  1007     History Chief Complaint  Patient presents with  . Shortness of Breath    Brian Wiley is a 86 y.o. male.  Patient w hx cva, htn, from [facility] via EMS with trouble breathing. Symptoms acute onset in past day, mod-severe, constant, persistent. On arrival noted to be hypoxic on room air with o2 sats in 70s and 80s. Patient with non prod cough. Patient limited historian, severe dyspnea, and ?dementia/alt ms - level 5 caveat. No report of fever. No report of trauma/fall.   The history is provided by the patient and the EMS personnel. The history is limited by the condition of the patient.  Shortness of Breath Associated symptoms: cough   Associated symptoms: no chest pain, no fever and no vomiting        Past Medical History:  Diagnosis Date  . BPH (benign prostatic hyperplasia)   . CVA (cerebral vascular accident) (HCC) 1994  . HTN (hypertension)     Patient Active Problem List   Diagnosis Date Noted  . Essential hypertension 06/21/2020  . AKI (acute kidney injury) (HCC) 06/18/2020  . Hypokalemia 06/18/2020  . Elevated troponin 06/18/2020  . Encephalopathy 06/18/2020    History reviewed. No pertinent surgical history.     No family history on file.  Social History   Tobacco Use  . Smoking status: Former Smoker    Types: Cigarettes  Substance Use Topics  . Alcohol use: Not Currently    Home Medications Prior to Admission medications   Medication Sig Start Date End Date Taking? Authorizing Provider  acetaminophen (TYLENOL) 325 MG tablet Take 2 tablets (650 mg total) by mouth every 6 (six) hours as needed for mild pain (or Fever >/= 101). 06/22/20   Sunnie Nielsen, DO  aspirin EC 81 MG tablet Take 81 mg by mouth daily. Swallow whole.    [provider]  atorvastatin (LIPITOR) 10 MG tablet Take 10 mg by mouth daily.    [provider]  carvedilol (COREG) 6.25 MG tablet Take 6.25 mg by mouth 2 (two) times daily with a meal.    [provider]  cefTRIAXone (ROCEPHIN) 1 g injection Inject 1 g into the muscle at bedtime. 06/22/20   Sunnie Nielsen, DO  finasteride (PROSCAR) 5 MG tablet Take 5 mg by mouth at bedtime.    [provider]  losartan (COZAAR) 25 MG tablet Take 1 tablet (25 mg total) by mouth daily. 06/22/20   Sunnie Nielsen, DO  polyethylene glycol (MIRALAX / GLYCOLAX) 17 g packet Take 17 g by mouth daily as needed for mild constipation. 06/22/20   Sunnie Nielsen, DO  tamsulosin (FLOMAX) 0.4 MG CAPS capsule Take 0.4 mg by mouth at bedtime.    [provider]    Allergies    Patient has no known allergies.  Review of Systems   Review of Systems  Unable to perform ROS: Severe respiratory distress  Constitutional: Negative for fever.  Respiratory: Positive for cough and shortness of breath.   Cardiovascular: Negative for chest pain.  Gastrointestinal: Negative for vomiting.  level 5 caveat - resp distress.  Physical Exam Updated Vital Signs BP (!) 152/87   Pulse 90   Temp 98.3 F (36.8 C) (Oral)   Resp (!) 25   SpO2 (!) 88%   Physical Exam Vitals and nursing note reviewed.  Constitutional:      Appearance: Normal  appearance. He is well-developed.  HENT:     Head: Atraumatic.     Nose: Nose normal.     Mouth/Throat:     Mouth: Mucous membranes are moist.     Pharynx: Oropharynx is clear.  Eyes:     General: No scleral icterus.    Conjunctiva/sclera: Conjunctivae normal.     Pupils: Pupils are equal, round, and reactive to light.  Neck:     Trachea: No tracheal deviation.  Cardiovascular:     Rate and Rhythm: Normal rate and regular rhythm.     Pulses: Normal pulses.     Heart sounds: Normal heart sounds. No murmur heard. No friction rub. No gallop.   Pulmonary:     Effort: Respiratory distress present. No accessory muscle usage.     Breath  sounds: Rales present.     Comments: Right rales/rhonchi. Mild wheezing.  Abdominal:     General: Bowel sounds are normal. There is no distension.     Palpations: Abdomen is soft.     Tenderness: There is no abdominal tenderness. There is no guarding.  Genitourinary:    Comments: No cva tenderness. Musculoskeletal:        General: No tenderness.     Cervical back: Normal range of motion and neck supple. No rigidity.     Comments: RLE/foot swelling > LLL.  Skin:    General: Skin is warm and dry.     Findings: No rash.  Neurological:     Mental Status: He is alert.     Comments: Awake and alert. Moves bil ext purposefully.   Psychiatric:        Mood and Affect: Mood normal.     ED Results / Procedures / Treatments   Labs (all labs ordered are listed, but only abnormal results are displayed) Results for orders placed or performed during the hospital encounter of September 02, 2020  Resp Panel by RT-PCR (Flu A&B, Covid) Nasopharyngeal Swab   Specimen: Nasopharyngeal Swab; Nasopharyngeal(NP) swabs in vial transport medium  Result Value Ref Range   SARS Coronavirus 2 by RT PCR NEGATIVE NEGATIVE   Influenza A by PCR NEGATIVE NEGATIVE   Influenza B by PCR NEGATIVE NEGATIVE  CBC  Result Value Ref Range   WBC 10.5 4.0 - 10.5 K/uL   RBC 4.53 4.22 - 5.81 MIL/uL   Hemoglobin 12.5 (L) 13.0 - 17.0 g/dL   HCT 16.1 09.6 - 04.5 %   MCV 96.7 80.0 - 100.0 fL   MCH 27.6 26.0 - 34.0 pg   MCHC 28.5 (L) 30.0 - 36.0 g/dL   RDW 40.9 81.1 - 91.4 %   Platelets 229 150 - 400 K/uL   nRBC 0.0 0.0 - 0.2 %  Comprehensive metabolic panel  Result Value Ref Range   Sodium 141 135 - 145 mmol/L   Potassium 4.4 3.5 - 5.1 mmol/L   Chloride 100 98 - 111 mmol/L   CO2 32 22 - 32 mmol/L   Glucose, Bld 125 (H) 70 - 99 mg/dL   BUN 16 8 - 23 mg/dL   Creatinine, Ser 7.82 0.61 - 1.24 mg/dL   Calcium 8.6 (L) 8.9 - 10.3 mg/dL   Total Protein 6.8 6.5 - 8.1 g/dL   Albumin 2.7 (L) 3.5 - 5.0 g/dL   AST 16 15 - 41 U/L   ALT  12 0 - 44 U/L   Alkaline Phosphatase 53 38 - 126 U/L   Total Bilirubin 0.3 0.3 - 1.2 mg/dL   GFR, Estimated >95 >  60 mL/min   Anion gap 9 5 - 15  Brain natriuretic peptide  Result Value Ref Range   B Natriuretic Peptide 153.4 (H) 0.0 - 100.0 pg/mL  Lactic acid, plasma  Result Value Ref Range   Lactic Acid, Venous 1.4 0.5 - 1.9 mmol/L  D-dimer, quantitative (not at [facility])  Result Value Ref Range   D-Dimer, Quant 3.13 (H) 0.00 - 0.50 ug/mL-FEU  Troponin I (High Sensitivity)  Result Value Ref Range   Troponin I (High Sensitivity) 104 (HH) <18 ng/L  Troponin I (High Sensitivity)  Result Value Ref Range   Troponin I (High Sensitivity) 163 (HH) <18 ng/L   CT Angio Chest PE W/Cm &/Or Wo Cm  Result Date: 2020/08/29 CLINICAL DATA:  Shortness of breath, abnormal chest radiograph, evaluate for PE EXAM: CT ANGIOGRAPHY CHEST WITH CONTRAST TECHNIQUE: Multidetector CT imaging of the chest was performed using the standard protocol during bolus administration of intravenous contrast. Multiplanar CT image reconstructions and MIPs were obtained to evaluate the vascular anatomy. CONTRAST:  55mL OMNIPAQUE IOHEXOL 350 MG/ML SOLN COMPARISON:  Chest radiograph dated 08/12/2020 FINDINGS: Cardiovascular: Satisfactory opacification of the bilateral pulmonary arteries to the segmental level. No evidence of pulmonary embolism. Although not tailored for evaluation of the thoracic aorta, there is no evidence of thoracic aortic aneurysm or dissection. Mild atherosclerotic calcifications of the aortic arch. The heart is top-normal in size.  No pericardial effusion. Mild three-vessel coronary atherosclerosis. Mediastinum/Nodes: No suspicious mediastinal lymphadenopathy. Visualized thyroid is unremarkable. Lungs/Pleura: Compressive atelectasis in the bilateral lower lobes. Additional multifocal patchy opacities in the right upper and middle lobes, compatible with pneumonia. No suspicious pulmonary nodules. Mild eventration of  the right hemidiaphragm. No pleural effusion or pneumothorax. Upper Abdomen: Visualized upper abdomen is notable for mild stranding along the pancreatic tail with a suspected underlying 2.2 cm lesion (series 5/image 127). This likely reflects a pseudocyst in the setting of recent pancreatitis, but is technically incompletely evaluated. Musculoskeletal: Mild degenerative changes of the visualized thoracolumbar spine. Review of the MIP images confirms the above findings. IMPRESSION: No evidence of pulmonary embolism. Multifocal right lung pneumonia. Additional bilateral lower lobe atelectasis. Mild peripancreatic stranding with underlying 2.2 cm pancreatic tail lesion, favoring recent pancreatitis with underlying pseudocyst. Correlate with laboratory evaluation and consider CT abdomen with/without contrast for further evaluation as clinically warranted. Aortic Atherosclerosis (ICD10-I70.0). Electronically Signed   By: Charline Bills M.D.   On: 08-29-20 13:43   DG Chest Port 1 View  Result Date: 2020/08/29 CLINICAL DATA:  Acute onset shortness of breath and oxygen desaturation today. EXAM: PORTABLE CHEST 1 VIEW COMPARISON:  Single-view of the chest 06/18/2020 06/13/2020. FINDINGS: Elevation of the right hemidiaphragm is again seen. There is new right basilar airspace disease. The left lung is clear. Heart size is upper normal. No pneumothorax or pleural fluid. IMPRESSION: New right basilar airspace disease could be due to aspiration, pneumonia or atelectasis. Electronically Signed   By: Drusilla Kanner M.D.   On: 2020-08-29 10:53    EKG EKG Interpretation  Date/Time:  Tuesday 2020/08/29 10:50:18 EST Ventricular Rate:  85 PR Interval:    QRS Duration: 85 QT Interval:  393 QTC Calculation: 468 R Axis:   6 Text Interpretation: Sinus rhythm Nonspecific ST abnormality Reconfirmed by Cathren Laine ([id number]) on August 29, 2020 1:53:29 PM   Radiology CT Angio Chest PE W/Cm &/Or Wo Cm  Result Date:  29-Aug-2020 CLINICAL DATA:  Shortness of breath, abnormal chest radiograph, evaluate for PE EXAM: CT ANGIOGRAPHY CHEST WITH CONTRAST TECHNIQUE:  Multidetector CT imaging of the chest was performed using the standard protocol during bolus administration of intravenous contrast. Multiplanar CT image reconstructions and MIPs were obtained to evaluate the vascular anatomy. CONTRAST:  75mL OMNIPAQUE IOHEXOL 350 MG/ML SOLN COMPARISON:  Chest radiograph dated 08/12/2020 FINDINGS: Cardiovascular: Satisfactory opacification of the bilateral pulmonary arteries to the segmental level. No evidence of pulmonary embolism. Although not tailored for evaluation of the thoracic aorta, there is no evidence of thoracic aortic aneurysm or dissection. Mild atherosclerotic calcifications of the aortic arch. The heart is top-normal in size.  No pericardial effusion. Mild three-vessel coronary atherosclerosis. Mediastinum/Nodes: No suspicious mediastinal lymphadenopathy. Visualized thyroid is unremarkable. Lungs/Pleura: Compressive atelectasis in the bilateral lower lobes. Additional multifocal patchy opacities in the right upper and middle lobes, compatible with pneumonia. No suspicious pulmonary nodules. Mild eventration of the right hemidiaphragm. No pleural effusion or pneumothorax. Upper Abdomen: Visualized upper abdomen is notable for mild stranding along the pancreatic tail with a suspected underlying 2.2 cm lesion (series 5/image 127). This likely reflects a pseudocyst in the setting of recent pancreatitis, but is technically incompletely evaluated. Musculoskeletal: Mild degenerative changes of the visualized thoracolumbar spine. Review of the MIP images confirms the above findings. IMPRESSION: No evidence of pulmonary embolism. Multifocal right lung pneumonia. Additional bilateral lower lobe atelectasis. Mild peripancreatic stranding with underlying 2.2 cm pancreatic tail lesion, favoring recent pancreatitis with underlying  pseudocyst. Correlate with laboratory evaluation and consider CT abdomen with/without contrast for further evaluation as clinically warranted. Aortic Atherosclerosis (ICD10-I70.0). Electronically Signed   By: Charline BillsSriyesh  Krishnan M.D.   On: Dec 25, 2019 13:43   DG Chest Port 1 View  Result Date: Dec 25, 2019 CLINICAL DATA:  Acute onset shortness of breath and oxygen desaturation today. EXAM: PORTABLE CHEST 1 VIEW COMPARISON:  Single-view of the chest 06/18/2020 06/13/2020. FINDINGS: Elevation of the right hemidiaphragm is again seen. There is new right basilar airspace disease. The left lung is clear. Heart size is upper normal. No pneumothorax or pleural fluid. IMPRESSION: New right basilar airspace disease could be due to aspiration, pneumonia or atelectasis. Electronically Signed   By: Drusilla Kannerhomas  Dalessio M.D.   On: Dec 25, 2019 10:53    Procedures Procedures (including critical care time)  Medications Ordered in ED Medications  albuterol (VENTOLIN HFA) 108 (90 Base) MCG/ACT inhaler 2 puff (2 puffs Inhalation Given 08-24-20 1232)  cefTRIAXone (ROCEPHIN) 1 g in sodium chloride 0.9 % 100 mL IVPB (0 g Intravenous Stopped 08-24-20 1311)  azithromycin (ZITHROMAX) 500 mg in sodium chloride 0.9 % 250 mL IVPB (500 mg Intravenous New Bag/Given (Non-Interop) 08-24-20 1228)  AeroChamber Plus Flo-Vu Large MISC (1 each  Given 08-24-20 1232)  albuterol (VENTOLIN HFA) 108 (90 Base) MCG/ACT inhaler 4 puff (4 puffs Inhalation Given 08-24-20 1333)  iohexol (OMNIPAQUE) 350 MG/ML injection 75 mL (75 mLs Intravenous Contrast Given 08-24-20 1324)    ED Course  I have reviewed the triage vital signs and the nursing notes.  Pertinent labs & imaging results that were available during my care of the patient were reviewed by me and considered in my medical decision making (see chart for details).    MDM Rules/Calculators/A&P                          Iv ns. Continuous pulse ox and cardiac monitoring. Stat labs and imaging.   o2 mask/nrb. Respiratory therapy consulted.  MDM Number of Diagnoses or Management Options Acute respiratory failure with hypoxia [facility](HCC): new, needed workup Community acquired pneumonia of  right middle lobe of lung: new, needed workup Community acquired pneumonia of right upper lobe of lung: new, needed workup Elevated troponin: new, needed workup   Amount and/or Complexity of Data Reviewed Clinical lab tests: ordered and reviewed Tests in the radiology section of CPT: ordered and reviewed Tests in the medicine section of CPT: ordered and reviewed Discussion of test results with the performing providers: yes Decide to obtain previous medical records or to obtain history from someone other than the patient: yes Obtain history from someone other than the patient: yes Review and summarize past medical records: yes Discuss the patient with other providers: yes Independent visualization of images, tracings, or specimens: yes  Risk of Complications, Morbidity, and/or Mortality Presenting problems: high Diagnostic procedures: high Management options: high   Albuterol treatment.   Reviewed nursing notes and prior charts for additional history.   CXR reviewed/interpreted by me - ?right pna.  Labs reviewed/interpreted by me - mild elev trop. Delta pending. No current chest pain.   Additional labs reviewed/interpreted by me - ddimer high. Will get cta r/o PE. Covid swab is negative.   After cultures, iv antibiotics given.   Medicine consulted for admission. Discussed pt, imaging, labs - will admit.   CRITICAL CARE RE: acute respiratory failure w hypoxia, right-sided multifocal pneumonia, elevated troponin Performed by: Suzi Roots Total critical care time: 75 minutes Critical care time was exclusive of separately billable procedures and treating other patients. Critical care was necessary to treat or prevent imminent or life-threatening deterioration. Critical care was time  spent personally by me on the following activities: development of treatment plan with patient and/or surrogate as well as nursing, discussions with consultants, evaluation of patient's response to treatment, examination of patient, obtaining history from patient or surrogate, ordering and performing treatments and interventions, ordering and review of laboratory studies, ordering and review of radiographic studies, pulse oximetry and re-evaluation of patient's condition.    Final Clinical Impression(s) / ED Diagnoses Final diagnoses:  None    Rx / DC Orders ED Discharge Orders    None           Cathren Laine, MD 08/23/2020 [id number]

## 2020-08-20 NOTE — H&P (Addendum)
History and Physical    Brian Wiley SWH:[id number] DOB: 24-Feb-1933 DOA: 08/02/2020  Referring MD/NP/PA: Cathren Laine, MD PCP: Patient, [name]  Patient coming from: [facility] facility via EMS  Chief Complaint: Respiratory distress  I have personally briefly reviewed patient's old medical records in [facility]   HPI: Brian Wiley is a 86 y.o. male with medical history significant of hypertension, CVA in 1994, encephalopathy with suspected underlying dementia, and BPH presents after having acute onset of respiratory distress sometime before 9 AM this morning.  History is obtained mostly from the patient daughter and review of records.  O2 saturations were reported to have acutely dropped into the 70s.  It is unclear the patient had been eating breakfast this morning prior to onset of symptoms.  On a nonrebreather fractious O2 saturations were around 80% and EMS placed him on CPAP with O2 saturation is noted to be around 89%.    The patient had recently moved here from [place] approximately 2 months ago and after getting here had an acute decline. His daughter states that he had a previous stroke, but was previously able to get around on his own.  He had been admitted into the hospital in 05/2020 with acute encephalopathy thought to be secondary to dementia with suspected UTI.  MRI of his brain at that time did not show any acute strokes.  Ultimately patient was treated and discharged to [facility] due to continued weakness.  Patient had been started on risperidone for to aggression and hostility issues.  Since patient had been at the nursing facility they had done a Mini-Mental state exam which gave concern for dementia, but the patient has not formally been seen by neurology.  She reports that gone to see him last week and states that it was difficult to understand what he was saying.  He also has had a mild cough.   ED Course: Upon admission to the emergency  department patient was seen to be afebrile, pulse 76-106, respirations 21-28, blood pressures elevated 173/88, and O2 saturations noted to be maintained above 90% on facemask.  Labs are significant for WBC 10.5, hemoglobin 12.5, albumin 2.7, BNP 153.4, high-sensitivity troponin 104, and D-dimer 3.13.  Initial chest x-ray noted a new right basilar opacity concerning for aspiration or pneumonia.  Due to the elevated D-dimer a CT angiogram was obtained which showed multifocal right lung pneumonia and mild perinephric stranding and 2.2 cm pancreatic tail lesion.  COVID-19 and influenza screen were negative.  Blood cultures were obtained and patient was started on empiric antibiotics of Rocephin and azithromycin.  Review of Systems  Unable to perform ROS: Severe respiratory distress    Past Medical History:  Diagnosis Date  . BPH (benign prostatic hyperplasia)   . CVA (cerebral vascular accident) (HCC) 1994  . HTN (hypertension)     History reviewed. No pertinent surgical history.   reports that he has quit smoking. His smoking use included cigarettes. He does not have any smokeless tobacco history on file. He reports previous alcohol use. No history on file for drug use.  No Known Allergies  No significant family history appreciated  Prior to Admission medications   Medication Sig Start Date End Date Taking? Authorizing Provider  acetaminophen (TYLENOL) 325 MG tablet Take 2 tablets (650 mg total) by mouth every 6 (six) hours as needed for mild pain (or Fever >/= 101). 06/22/20   Sunnie Nielsen, DO  aspirin EC 81 MG tablet Take 81 mg by  mouth daily. Swallow whole.    [provider]  atorvastatin (LIPITOR) 10 MG tablet Take 10 mg by mouth daily.    [provider]  carvedilol (COREG) 6.25 MG tablet Take 6.25 mg by mouth 2 (two) times daily with a meal.    [provider]  cefTRIAXone (ROCEPHIN) 1 g injection Inject 1 g into the muscle at bedtime. 06/22/20    Sunnie Nielsen, DO  finasteride (PROSCAR) 5 MG tablet Take 5 mg by mouth at bedtime.    [provider]  losartan (COZAAR) 25 MG tablet Take 1 tablet (25 mg total) by mouth daily. 06/22/20   Sunnie Nielsen, DO  polyethylene glycol (MIRALAX / GLYCOLAX) 17 g packet Take 17 g by mouth daily as needed for mild constipation. 06/22/20   Sunnie Nielsen, DO  tamsulosin (FLOMAX) 0.4 MG CAPS capsule Take 0.4 mg by mouth at bedtime.    [provider]    Physical Exam:  Constitutional: Elderly male who is difficult to understand but able to follow simple commands Vitals:   08/19/2020 1215 08/11/2020 1300 08/03/2020 1330 07/26/2020 1333  BP: (!) 156/89 121/89 (!) 135/92 (!) 152/87  Pulse: 81 86 86 90  Resp: (!) 26 (!) 24 (!) 27 (!) 25  Temp:      TempSrc:      SpO2: 97% 90%  (!) 88%   Eyes: PERRL, lids and conjunctivae normal ENMT: Mucous membranes are moist. Posterior pharynx clear of any exudate or lesions.  Neck: normal, supple, no masses, no thyromegaly Respiratory: Tachypneic with rhonchi appreciated on the right lung field.  Currently on nonrebreather with O2 saturations maintained Cardiovascular: Regular rate and rhythm, no murmurs / rubs / gallops. Lower extremity edema appreciated . 2+ pedal pulses. No carotid bruits.  Abdomen: no tenderness, no masses palpated. No hepatosplenomegaly. Bowel sounds positive.  Musculoskeletal: no clubbing / cyanosis. No joint deformity upper and lower extremities. Good ROM, no contractures. Normal muscle tone.  Skin: no rashes, lesions, ulcers. No induration  Neurologic: CN 2-12 grossly intact. Sensation intact, DTR normal. Strength 5/5 in all 4.  Psychiatric: lethargic, but arousable.    Labs on Admission: I have personally reviewed following labs and imaging studies  CBC: Recent Labs  Lab 08/06/2020 1020  WBC 10.5  HGB 12.5*  HCT 43.8  MCV 96.7  PLT 229   Basic Metabolic Panel: Recent Labs  Lab 08/23/2020 1020  NA 141   K 4.4  CL 100  CO2 32  GLUCOSE 125*  BUN 16  CREATININE 1.03  CALCIUM 8.6*   GFR: CrCl cannot be calculated (Unknown ideal weight.). Liver Function Tests: Recent Labs  Lab 08/10/2020 1020  AST 16  ALT 12  ALKPHOS 53  BILITOT 0.3  PROT 6.8  ALBUMIN 2.7*   No results for input(s): LIPASE, AMYLASE in the last 168 hours. No results for input(s): AMMONIA in the last 168 hours. Coagulation Profile: No results for input(s): INR, PROTIME in the last 168 hours. Cardiac Enzymes: No results for input(s): CKTOTAL, CKMB, CKMBINDEX, TROPONINI in the last 168 hours. BNP (last 3 results) No results for input(s): PROBNP in the last 8760 hours. HbA1C: No results for input(s): HGBA1C in the last 72 hours. CBG: No results for input(s): GLUCAP in the last 168 hours. Lipid Profile: No results for input(s): CHOL, HDL, LDLCALC, TRIG, CHOLHDL, LDLDIRECT in the last 72 hours. Thyroid Function Tests: No results for input(s): TSH, T4TOTAL, FREET4, T3FREE, THYROIDAB in the last 72 hours. Anemia Panel: No results for  input(s): VITAMINB12, FOLATE, FERRITIN, TIBC, IRON, RETICCTPCT in the last 72 hours. Urine analysis:    Component Value Date/Time   COLORURINE YELLOW 06/18/2020 1933   APPEARANCEUR HAZY (A) 06/18/2020 1933   LABSPEC 1.019 06/18/2020 1933   PHURINE 5.0 06/18/2020 1933   GLUCOSEU NEGATIVE 06/18/2020 1933   HGBUR SMALL (A) 06/18/2020 1933   BILIRUBINUR NEGATIVE 06/18/2020 1933   KETONESUR NEGATIVE 06/18/2020 1933   PROTEINUR 30 (A) 06/18/2020 1933   NITRITE NEGATIVE 06/18/2020 1933   LEUKOCYTESUR MODERATE (A) 06/18/2020 1933   Sepsis Labs: Recent Results (from the past 240 hour(s))  Resp Panel by RT-PCR (Flu A&B, Covid) Nasopharyngeal Swab     Status: None   Collection Time: 12/23/19 10:38 AM   Specimen: Nasopharyngeal Swab; Nasopharyngeal(NP) swabs in vial transport medium  Result Value Ref Range Status   SARS Coronavirus 2 by RT PCR NEGATIVE NEGATIVE Final    Comment:  (NOTE) SARS-CoV-2 target nucleic acids are NOT DETECTED.  The SARS-CoV-2 RNA is generally detectable in upper respiratory specimens during the acute phase of infection. The lowest concentration of SARS-CoV-2 viral copies this assay can detect is 138 copies/mL. A negative result does not preclude SARS-Cov-2 infection and should not be used as the sole basis for treatment or other patient management decisions. A negative result may occur with  improper specimen collection/handling, submission of specimen other than nasopharyngeal swab, presence of viral mutation(s) within the areas targeted by this assay, and inadequate number of viral copies(<138 copies/mL). A negative result must be combined with clinical observations, patient history, and epidemiological information. The expected result is Negative.  Fact Sheet for Patients:  BloggerCourse.comhttps://www.fda.gov/media/152166/download  Fact Sheet for Healthcare Providers:  SeriousBroker.ithttps://www.fda.gov/media/152162/download  This test is no t yet approved or cleared by the [place] FDA and  has been authorized for detection and/or diagnosis of SARS-CoV-2 by FDA under an Emergency Use Authorization (EUA). This EUA will remain  in effect (meaning this test can be used) for the duration of the COVID-19 declaration under Section 564(b)(1) of the Act, 21 U.S.C.section 360bbb-3(b)(1), unless the authorization is terminated  or revoked sooner.       Influenza A by PCR NEGATIVE NEGATIVE Final   Influenza B by PCR NEGATIVE NEGATIVE Final    Comment: (NOTE) The Xpert Xpress SARS-CoV-2/FLU/RSV plus assay is intended as an aid in the diagnosis of influenza from Nasopharyngeal swab specimens and should not be used as a sole basis for treatment. Nasal washings and aspirates are unacceptable for Xpert Xpress SARS-CoV-2/FLU/RSV testing.  Fact Sheet for Patients: BloggerCourse.comhttps://www.fda.gov/media/152166/download  Fact Sheet for Healthcare  Providers: SeriousBroker.ithttps://www.fda.gov/media/152162/download  This test is not yet approved or cleared by the [place] FDA and has been authorized for detection and/or diagnosis of SARS-CoV-2 by FDA under an Emergency Use Authorization (EUA). This EUA will remain in effect (meaning this test can be used) for the duration of the COVID-19 declaration under Section 564(b)(1) of the Act, 21 U.S.C. section 360bbb-3(b)(1), unless the authorization is terminated or revoked.  Performed at [facility], [address] [place], [place] [address]      Radiological Exams on Admission: CT Angio Chest PE W/Cm &/Or Wo Cm  Result Date: 06/03/20 CLINICAL DATA:  Shortness of breath, abnormal chest radiograph, evaluate for PE EXAM: CT ANGIOGRAPHY CHEST WITH CONTRAST TECHNIQUE: Multidetector CT imaging of the chest was performed using the standard protocol during bolus administration of intravenous contrast. Multiplanar CT image reconstructions and MIPs were obtained to evaluate the vascular anatomy. CONTRAST:  75mL  OMNIPAQUE IOHEXOL 350 MG/ML SOLN COMPARISON:  Chest radiograph dated 08/12/2020 FINDINGS: Cardiovascular: Satisfactory opacification of the bilateral pulmonary arteries to the segmental level. No evidence of pulmonary embolism. Although not tailored for evaluation of the thoracic aorta, there is no evidence of thoracic aortic aneurysm or dissection. Mild atherosclerotic calcifications of the aortic arch. The heart is top-normal in size.  No pericardial effusion. Mild three-vessel coronary atherosclerosis. Mediastinum/Nodes: No suspicious mediastinal lymphadenopathy. Visualized thyroid is unremarkable. Lungs/Pleura: Compressive atelectasis in the bilateral lower lobes. Additional multifocal patchy opacities in the right upper and middle lobes, compatible with pneumonia. No suspicious pulmonary nodules. Mild eventration of the right hemidiaphragm. No pleural effusion or pneumothorax. Upper Abdomen:  Visualized upper abdomen is notable for mild stranding along the pancreatic tail with a suspected underlying 2.2 cm lesion (series 5/image 127). This likely reflects a pseudocyst in the setting of recent pancreatitis, but is technically incompletely evaluated. Musculoskeletal: Mild degenerative changes of the visualized thoracolumbar spine. Review of the MIP images confirms the above findings. IMPRESSION: No evidence of pulmonary embolism. Multifocal right lung pneumonia. Additional bilateral lower lobe atelectasis. Mild peripancreatic stranding with underlying 2.2 cm pancreatic tail lesion, favoring recent pancreatitis with underlying pseudocyst. Correlate with laboratory evaluation and consider CT abdomen with/without contrast for further evaluation as clinically warranted. Aortic Atherosclerosis (ICD10-I70.0). Electronically Signed   By: Charline Bills M.D.   On: 10-Sep-2020 13:43   DG Chest Port 1 View  Result Date: 2020/09/10 CLINICAL DATA:  Acute onset shortness of breath and oxygen desaturation today. EXAM: PORTABLE CHEST 1 VIEW COMPARISON:  Single-view of the chest 06/18/2020 06/13/2020. FINDINGS: Elevation of the right hemidiaphragm is again seen. There is new right basilar airspace disease. The left lung is clear. Heart size is upper normal. No pneumothorax or pleural fluid. IMPRESSION: New right basilar airspace disease could be due to aspiration, pneumonia or atelectasis. Electronically Signed   By: Drusilla Kanner M.D.   On: 2020/09/10 10:53    EKG: Independently reviewed. Normal sinus rhythm at 85 bpm  Assessment/Plan Respiratory failure with hypoxia  Sepsis secondary to suspected aspiration pneumonia: Acute.  Patient with tachycardia and tachypnea found to have multifocal right-sided pneumonia on CT image without signs of a pulmonary embolus.  O2 saturations reported to be as low as 70s.  Placed on nonrebreather mask here with O2 saturations currently maintained. -Admit to a medical  telemetry bed -Continuous pulse oximetry with nasal cannula oxygen to maintain O2 saturation greater than 90% -Aspiration precautions with elevation of the head of the bed -Check ABG -Continue empiric antibiotics of Rocephin and azithromycin -Speech therapy consult   -PCCM consulted due to worsening lethargy and concern for airway protection  Encephalopathy/dementia: Patient noted to be lethargic and difficult to understand.  Family reports that his issues with his speech are at baseline.  Last MRI October did not show any acute infarcts. -Neuro Checks -Follow-up ABG -If symptoms do not improve would recommend further imaging of the brain  Elevated troponin: Acute.  High-sensitivity troponin 104->163.  EKG without significant ischemic changes.  BNP was mildly elevated at 153.4, but patient did not appear acutely fluid overloaded.  CT imaging of the chest did not show any signs of a pulmonary embolus or edema. -Check echocardiogram  Normocytic anemia: Stable. Hemoglobin 12.5 g/dL. -Continue monitor  Pancreatic stranding: Mild peripancreatic stranding with 2.2 cm pancreatic tail lesion seen on CTA of the chest most likely to be pancreatitis with pseudocyst.  Lipase however was only noted to be 28.  Protein calorie  malnutrition: On admission albumin low at 2.7. -Check prealbumin  Essential hypertension: Blood pressures currently stable.  Home medication regimen includes Coreg 6.25 mg twice daily, losartan 25 mg daily. -Consider restarting home blood pressure medications when able to tolerate p.o.  BPH: Home medications include Proscar 5 mg daily and Flomax 0.4 mg daily. -Consider restarting when able to tolerate p.o.  DVT prophylaxis: lovenox Code Status: DNR Family Communication: Discussed plan of care with patient's daughter over the phone Disposition Plan: likely discharge back to nursing facility once medically stable Consults called:Speech therapy Admission status: Inpatient,  require more than 2 midnight stay for  Clydie Braun MD [facility]   If 7PM-7AM, please contact night-coverage   08-23-2020, 2:17 PM

## 2020-08-20 NOTE — Significant Event (Signed)
Rapid Response Event Note   Reason for Call :  Decreased LOC, accessory muscle use  Initial Focused Assessment:  Pt lying in bed. Skin is dry, warm, pink- extremities are cool. BBS are rhonchus in the uppers and diminished in the bases. Accessory muscle use. PERRLA, 49mm. Pt withdrawals from pain.   VS: T 98.49F, BP 166/88, HR 87, RR 22, SpO2 97% on 100% NRB CBG: 99  Interventions:  ABG: 7.231/ 86.7/ 77.4/ 35  Plan of Care:  -q4h oral care- remove BiPAP to provide oral care/oral suctioning and evaluate pt for upper airway secretions, PRN NTS -Monitor pt for improved mentation in response to correction of hypercarbia -Aspiration precautions  Call rapid response for additional needs  Event Summary:  MD Notified: Dr. Katrinka Blazing Call Time: 1750 Arrival Time: 1755 End Time: 1905  Jennye Moccasin, RN

## 2020-08-20 NOTE — Progress Notes (Signed)
Pharmacy Antibiotic Note  Brian Wiley is a 86 y.o. male admitted on 07/28/2020 with respiratory distress.  Pharmacy has been consulted for Zosyn dosing for aspiration pneumonia.  WBC 10.5, Tmax 99.8 F; Scr 1.03 (CrCl 63.5 ml/min), renal function stable since 10/21, per Epic records  Plan: Zosyn 3.375 gm IV Q 8 hrs (extended infusion) Monitor WBC, temp, clinical improvement, renal function, cultures  Total Body Weight: 108 kg Height: 69 inches  Temp (24hrs), Avg:99.1 F (37.3 C), Min:98.3 F (36.8 C), Max:99.8 F (37.7 C)  Recent Labs  Lab 07/25/2020 1020 08/23/2020 1154  WBC 10.5  --   CREATININE 1.03  --   LATICACIDVEN  --  1.4    CrCl cannot be calculated (Unknown ideal weight.).    No Known Allergies  Antimicrobials this admission: 12/28 azithromycin X 1 12/28 ceftriaxone X 1 12/28 Zosyn >>  Microbiology results: 12/28 BCx X 2: pending 12/28 COVID, flu A, flu B, HIV screen: all negative  Thank you for allowing pharmacy to be a part of this patient's care.  Vicki Mallet, PharmD, BCPS, [facility] Clinical Pharmacist 08/21/2020 6:43 PM

## 2020-08-20 NOTE — Progress Notes (Signed)
Pt arrived to 4E from ED. Pt unresponsive. Bilateral extremities cold, dry. Bilateral pedal pulses present. Pt on NRB @ 97%. Respiratory called. Rapid response called. Telemetry applied, CCMD notified. MD notified. CHG bath complete.  Kenard Gower, RN

## 2020-08-20 NOTE — ED Notes (Signed)
EDP Steinl was made aware of the 104 Troponin.

## 2020-08-20 NOTE — ED Notes (Signed)
Patient transported to CT 

## 2020-08-20 NOTE — Plan of Care (Signed)
I spoke to Brian Wiley' daughter Brian Wiley regarding his care. Her mother was added to the phone call to discuss his care. They understand that progressive weakness and decline and chronic aspiration are associated with a poor prognosis due to frailty and limited reserve. His daughter understands that if he were to develop progressive respiratory failure and require MV, he would be unlikely to survive his ICU illness. They understand and wish to maximize his care noninvasively. If he deteriorates despite aggressive care, they wish to transition to focus on comfort care. Code status changed to DNR. RN at bedside and Dr. Katrinka Blazing updated on changes.  Steffanie Dunn, DO August 29, 2020 7:09 PM [facility]

## 2020-08-20 NOTE — Consult Note (Signed)
NAME:  Brian Wiley, MRN:  [id number], DOB:  1933-07-09, LOS: 0 ADMISSION DATE:  09-13-20, CONSULTATION DATE:  Sep 13, 2020 REFERRING MD:  Katrinka Blazing  CHIEF COMPLAINT:  SOB, AMS   Brief History   Brian Wiley is a 86 y.o. male who was admitted with AMS and hypoxia likely felt to be 2/2 aspiration PNA. PCCM called evening 12/28 for worsening mental status 2/2 hypercapnia.  History of present illness   Pt is encephelopathic; therefore, this HPI is obtained from chart review. Brian Wiley is a 86 y.o. male who has a PMH including but not limited to prior CVA and suspected underlying dementia who resides at SNF (see "past medical history" for rest).  He presented to [facility] ED 12/28 with AMS and hypoxiafollowing breakfast.  It was felt that pt likely aspirated.  He was apparently admitted to hospital 05/2020 with acute encephalopathy felt to be 2/2 UTI and underlying dementia.  Following that admission, he was discharged to [facility] due to continued weakness.  In ED, he was found to have hypoxia requiring NRB.  CTA chest was negative for PE but did show multifocal right sided PNA likely 2/2 underlying aspiration.  He was admitted by [facility] and was placed on rocephin and azithromycin.  Later that afternoon, he had worsening mental status.  ABG showed acute (with likely some component chronic) hypoxic and hypercapnic respiratory failure.  Due to his AMS, PCCM was asked to see in consultation.  Past Medical History  has AKI (acute kidney injury) (HCC); Hypokalemia; Elevated troponin; Encephalopathy; Essential hypertension; Sepsis due to pneumonia (HCC); Acute respiratory failure with hypoxia (HCC); and Protein calorie malnutrition (HCC) on their problem list.  Significant Hospital Events   12/28 > admit.  Consults:  PCCM.  Procedures:  None.  Significant Diagnostic Tests:  CTA chest 12/28 > neg for PE.  Multifocal right sided PNA.  Mild peripancreatic stranding with underlying 2.2 cm  pancreatic tail lesion.  Micro Data:  COVID 12/28 > neg. Flu 12/28 > neg. Blood 12/28 >   Antimicrobials:  Azithromycin 12/28 > 12/28. Ceftriaxone 12/28 > 12/28. Zosyn 12/28 >    Interim history/subjective:  Altered.  Has intact cough and gag with suctioning. Needs NTS and aggressive pulm hygiene.  Objective:  Blood pressure (!) 163/98, pulse 90, temperature 99.8 F (37.7 C), temperature source Rectal, resp. rate (!) 25, SpO2 94 %.        Intake/Output Summary (Last 24 hours) at 09-13-20 1831 Last data filed at 09-13-20 1328 Gross per 24 hour  Intake 250 ml  Output --  Net 250 ml   There were no vitals filed for this visit.  Examination: General: Elderly male, chronically ill appearing. Neuro: Minimally responsive.  Does cough and gag with suctioning. HEENT: Horace/AT. Sclerae anicteric.  MM moist. Cardiovascular: RRR, no M/R/G.  Lungs: Respirations even and unlabored.  Coarse R > L, diminished bilateral bases. Abdomen: BS x 4, soft, NT/ND.  Musculoskeletal: No gross deformities, no edema.  Skin: Intact, warm, no rashes.  Assessment & Plan:   Acute hypoxic and hypercapnic respiratory failure. - Ok for BiPAP along with close monitoring and routine / frequent suctioning. - Needs NTS and aggressive pulmonary hygiene. - Chest PT. - No intubation after discussions with family. If he declines, transition to comfort.  Multifocal PNA - presumed 2/2 aspiration. - D/c Azithromycin and Ceftriaxone and switch to Zosyn. - Follow cultures. - SLP eval once mental status improves.  AMS - 2/2 above. - BiPAP as above.  Best  Practice:  Diet: NPO. Pain/Anxiety/Delirium protocol (if indicated): N/A. VAP protocol (if indicated): N/A. DVT prophylaxis: SCD's / Lovenox. GI prophylaxis: None. Glucose control: None. Mobility: Bedrest. Last date of multidisciplinary goals of care discussion: None. Family and staff present: None. Summary of discussion: None. Follow up goals of  care discussion due: 1/3. Code Status: DNR. Family Communication: Dr. Kemper Durie called daughter Annice Pih and wife to discuss goals of care.  They had an extensive discussion regarding recent hx of progressive weakness, concern for dementia, probable chronic aspiration, current circumstances and organ failures. They also discussed the challenges that intubation in this setting poses and the risks involved.  After discussion with family and reviewing patient's prior wishes under circumstances such as this, the family has decided not to perform intubation and / or resuscitation if arrest were to occur, but to otherwise continue with current medical support / therapies. Disposition: Progressive.  Labs   CBC: Recent Labs  Lab 09-12-2020 1020  WBC 10.5  HGB 12.5*  HCT 43.8  MCV 96.7  PLT 229   Basic Metabolic Panel: Recent Labs  Lab 2020-09-12 1020  NA 141  K 4.4  CL 100  CO2 32  GLUCOSE 125*  BUN 16  CREATININE 1.03  CALCIUM 8.6*   GFR: CrCl cannot be calculated (Unknown ideal weight.). Recent Labs  Lab 2020-09-12 1020 12-Sep-2020 1154  WBC 10.5  --   LATICACIDVEN  --  1.4   Liver Function Tests: Recent Labs  Lab 09-12-20 1020  AST 16  ALT 12  ALKPHOS 53  BILITOT 0.3  PROT 6.8  ALBUMIN 2.7*   Recent Labs  Lab 09/12/2020 1145  LIPASE 28   No results for input(s): AMMONIA in the last 168 hours. ABG    Component Value Date/Time   PHART 7.231 (L) 09-12-2020 1800   PCO2ART 86.7 (HH) September 12, 2020 1800   PO2ART 77.4 (L) Sep 12, 2020 1800   HCO3 35.0 (H) 12-Sep-2020 1800   O2SAT 93.0 09-12-2020 1800    Coagulation Profile: No results for input(s): INR, PROTIME in the last 168 hours. Cardiac Enzymes: No results for input(s): CKTOTAL, CKMB, CKMBINDEX, TROPONINI in the last 168 hours. HbA1C: No results found for: HGBA1C CBG: Recent Labs  Lab 09/12/2020 1759  GLUCAP 99    Review of Systems:   Unable to obtain as pt is encephalopathic.  Past medical history  He,  has a past  medical history of BPH (benign prostatic hyperplasia), CVA (cerebral vascular accident) (HCC) (1994), and HTN (hypertension).   Surgical History   History reviewed. No pertinent surgical history.   Social History   reports that he has quit smoking. His smoking use included cigarettes. He does not have any smokeless tobacco history on file. He reports previous alcohol use.   Family history   His family history is not on file.   Allergies No Known Allergies   Home meds  Prior to Admission medications   Medication Sig Start Date End Date Taking? Authorizing Provider  acetaminophen (TYLENOL) 325 MG tablet Take 2 tablets (650 mg total) by mouth every 6 (six) hours as needed for mild pain (or Fever >/= 101). 06/22/20   Sunnie Nielsen, DO  aspirin EC 81 MG tablet Take 81 mg by mouth daily. Swallow whole.    [provider]  atorvastatin (LIPITOR) 10 MG tablet Take 10 mg by mouth daily.    [provider]  carvedilol (COREG) 6.25 MG tablet Take 6.25 mg by mouth 2 (two) times daily with a meal.  [provider]  cefTRIAXone (ROCEPHIN) 1 g injection Inject 1 g into the muscle at bedtime. 06/22/20   Sunnie Nielsen, DO  finasteride (PROSCAR) 5 MG tablet Take 5 mg by mouth at bedtime.    [provider]  losartan (COZAAR) 25 MG tablet Take 1 tablet (25 mg total) by mouth daily. 06/22/20   Sunnie Nielsen, DO  polyethylene glycol (MIRALAX / GLYCOLAX) 17 g packet Take 17 g by mouth daily as needed for mild constipation. 06/22/20   Sunnie Nielsen, DO  tamsulosin (FLOMAX) 0.4 MG CAPS capsule Take 0.4 mg by mouth at bedtime.    [provider]   CC time: 30 min.   Rutherford Guys, [place] [facility] For pager details, please see AMION 08/21/2020, 6:31 PM

## 2020-08-20 NOTE — ED Triage Notes (Signed)
Pt arrives via [facility] from [facility] with c/o sudden onset SOB, pts sats initially in the 70s, placed on NRB and sats improved to the 80s. Pt placed on cpap on scene. Crackles and rales present upon auscultation. 89% sats on cpap. Hx of stroke with R sided deficits. Pt alert, can answer some questions. O2 sats92% on NRB in triage.

## 2020-08-20 NOTE — ED Notes (Signed)
Pt noted to be more lethargic, alert to painful stimuli; Dr. Katrinka Blazing notified

## 2020-08-20 NOTE — ED Notes (Signed)
Call the [name] at [phone] with an update please

## 2020-08-20 NOTE — Progress Notes (Signed)
CRITICAL VALUE ALERT  Critical Value:  PcO2 86.7   Date & Time Notied:  12/28 @1818   Provider Notified:   Orders Received/Actions taken: Waiting on return call

## 2020-08-20 NOTE — Progress Notes (Addendum)
Patient came in from the facility with a DNR in place.  However after talking with family and noting that patient's acute symptoms appeared to be reversible as he likely aspirated.  Daughter agreed that she wanted to rescind his DO NOT RESUSCITATE order.  PCCM was consulted to evaluate patient for possible need intubation and/or bronchoscopy.  After PCCM discussions with daughter CODE STATUS was thereafter switched back to DO NOT RESUSCITATE.  Will trial on BiPAP and progressive care and if no improvement in symptoms consider transitioning to comfort care.

## 2020-08-21 ENCOUNTER — Inpatient Hospital Stay (HOSPITAL_COMMUNITY): Payer: Medicare Other

## 2020-08-21 ENCOUNTER — Inpatient Hospital Stay (HOSPITAL_BASED_OUTPATIENT_CLINIC_OR_DEPARTMENT_OTHER): Payer: Medicare Other

## 2020-08-21 DIAGNOSIS — R609 Edema, unspecified: Secondary | ICD-10-CM | POA: Diagnosis not present

## 2020-08-21 DIAGNOSIS — J9601 Acute respiratory failure with hypoxia: Secondary | ICD-10-CM

## 2020-08-21 DIAGNOSIS — J690 Pneumonitis due to inhalation of food and vomit: Secondary | ICD-10-CM | POA: Diagnosis not present

## 2020-08-21 DIAGNOSIS — R7989 Other specified abnormal findings of blood chemistry: Secondary | ICD-10-CM

## 2020-08-21 LAB — CBC
HCT: 38.6 % — ABNORMAL LOW (ref 39.0–52.0)
Hemoglobin: 11.5 g/dL — ABNORMAL LOW (ref 13.0–17.0)
MCH: 28.7 pg (ref 26.0–34.0)
MCHC: 29.8 g/dL — ABNORMAL LOW (ref 30.0–36.0)
MCV: 96.3 fL (ref 80.0–100.0)
Platelets: 212 10*3/uL (ref 150–400)
RBC: 4.01 MIL/uL — ABNORMAL LOW (ref 4.22–5.81)
RDW: 14.5 % (ref 11.5–15.5)
WBC: 13.5 10*3/uL — ABNORMAL HIGH (ref 4.0–10.5)
nRBC: 0 % (ref 0.0–0.2)

## 2020-08-21 LAB — BLOOD GAS, ARTERIAL
Acid-Base Excess: 9 mmol/L — ABNORMAL HIGH (ref 0.0–2.0)
Bicarbonate: 34.9 mmol/L — ABNORMAL HIGH (ref 20.0–28.0)
FIO2: 50
O2 Saturation: 98.7 %
Patient temperature: 36.9
pCO2 arterial: 66.3 mmHg (ref 32.0–48.0)
pH, Arterial: 7.34 — ABNORMAL LOW (ref 7.350–7.450)
pO2, Arterial: 131 mmHg — ABNORMAL HIGH (ref 83.0–108.0)

## 2020-08-21 LAB — BASIC METABOLIC PANEL
Anion gap: 9 (ref 5–15)
BUN: 18 mg/dL (ref 8–23)
CO2: 35 mmol/L — ABNORMAL HIGH (ref 22–32)
Calcium: 8.3 mg/dL — ABNORMAL LOW (ref 8.9–10.3)
Chloride: 99 mmol/L (ref 98–111)
Creatinine, Ser: 1.16 mg/dL (ref 0.61–1.24)
GFR, Estimated: >60 mL/min (ref >60)
Glucose, Bld: 115 mg/dL — ABNORMAL HIGH (ref 70–99)
Potassium: 4.4 mmol/L (ref 3.5–5.1)
Sodium: 143 mmol/L (ref 135–145)

## 2020-08-21 LAB — ECHOCARDIOGRAM COMPLETE
Area-P 1/2: 3.37 cm2
Height: 69 in
P 1/2 time: 380 msec
S' Lateral: 3.73 cm
Weight: 3809.55 oz

## 2020-08-21 LAB — PREALBUMIN: Prealbumin: 10.2 mg/dL — ABNORMAL LOW (ref 18–38)

## 2020-08-21 NOTE — Progress Notes (Signed)
RT recommended patient to be taken off Bipap and placed on 4L. 4L applied O2 98%. Will continue to monitor.  Sabra Heck, RN

## 2020-08-21 NOTE — Progress Notes (Signed)
SLP Cancellation Note  Patient Details Name: Brian Wiley MRN: [id number] DOB: 1932-11-27   Cancelled treatment:       Reason Eval/Treat Not Completed: Medical issues which prohibited therapy. Pt currently on BiPAP. Will f/u as able.   Mahala Menghini., M.A. CCC-SLP Acute Rehabilitation Services Pager [phone] Office [phone]  08/21/2020, 8:40 AM

## 2020-08-21 NOTE — Consult Note (Addendum)
NAME:  Brian Wiley, MRN:  [id number], DOB:  October 21, 1932, LOS: 1 ADMISSION DATE:  08/14/2020, CONSULTATION DATE:  07/27/2020 REFERRING MD:  Katrinka Blazing  CHIEF COMPLAINT:  SOB, AMS   Brief History   Brian Wiley is a 86 y.o. male who was admitted with AMS and hypoxia likely felt to be 2/2 aspiration PNA. PCCM called evening 12/28 for worsening mental status 2/2 hypercapnia.  History of present illness   Pt is encephelopathic; therefore, this HPI is obtained from chart review. Brian Wiley is a 86 y.o. male who has a PMH including but not limited to prior CVA and suspected underlying dementia who resides at SNF (see "past medical history" for rest).  He presented to [facility] ED 12/28 with AMS and hypoxiafollowing breakfast.  It was felt that pt likely aspirated.  He was apparently admitted to hospital 05/2020 with acute encephalopathy felt to be 2/2 UTI and underlying dementia.  Following that admission, he was discharged to [facility] due to continued weakness.  In ED, he was found to have hypoxia requiring NRB.  CTA chest was negative for PE but did show multifocal right sided PNA likely 2/2 underlying aspiration.  He was admitted by [facility] and was placed on rocephin and azithromycin.  Later that afternoon, he had worsening mental status.  ABG showed acute (with likely some component chronic) hypoxic and hypercapnic respiratory failure.  Due to his AMS, PCCM was asked to see in consultation.  Past Medical History  has AKI (acute kidney injury) (HCC); Hypokalemia; Elevated troponin; Encephalopathy; Essential hypertension; Sepsis due to pneumonia (HCC); Acute respiratory failure with hypoxia (HCC); Protein calorie malnutrition (HCC); and Aspiration pneumonia (HCC) on their problem list.  Significant Hospital Events   12/28 > admit.  Consults:  PCCM.  Procedures:  None.  Significant Diagnostic Tests:  CTA chest 12/28 > neg for PE.  Multifocal right sided PNA.  Mild peripancreatic  stranding with underlying 2.2 cm pancreatic tail lesion.  Micro Data:  COVID 12/28 > neg. Flu 12/28 > neg. Blood 12/28 >   Antimicrobials:  Azithromycin 12/28 > 12/28. Ceftriaxone 12/28 > 12/28. Zosyn 12/28 >    Interim history/subjective:  Tolerating BiPAP  Objective:  Blood pressure 137/74, pulse 71, temperature 98.1 F (36.7 C), temperature source Axillary, resp. rate 12, height 5\' 9"  (1.753 m), weight 108 kg, SpO2 100 %.    FiO2 (%):  [50 %] 50 %   Intake/Output Summary (Last 24 hours) at 08/21/2020 0931 Last data filed at 07/26/2020 1328 Gross per 24 hour  Intake 250 ml  Output --  Net 250 ml   Filed Weights   07/25/2020 1835  Weight: 108 kg    Examination: General: Obese male no acute distress currently on BiPAP HEENT: No JVD lymphadenopathy is appreciated Neuro: Grossly intact requesting something to eat CV: Heart sounds are distant PULM: Diminished breath sounds throughout Vent currently on BiPAP FIO2 50%  GI: soft, bsx4 active  Extremities: warm/dry,  edema  Skin: no rashes or lesions   Assessment & Plan:   Acute hypoxic and hypercapnic respiratory failure.  Currently tolerating BiPAP will take him off Continue as needed and nightly Continue to monitor closely  Multifocal PNA - presumed 2/2 aspiration. Been changed to Zosyn Follow culture data Swallow eval to rule out aspiration  AMS - 2/2 above. BiPAP nightly and as needed  Best Practice:  Diet: NPO. Pain/Anxiety/Delirium protocol (if indicated): N/A. VAP protocol (if indicated): N/A. DVT prophylaxis: SCD's / Lovenox. GI prophylaxis: None. Glucose control:  None. Mobility: Bedrest. Last date of multidisciplinary goals of care discussion: None. Family and staff present: None. Summary of discussion: None. Follow up goals of care discussion due: 1/3. Code Status: DNR. Family Communication: Dr. Kemper Durie called daughter Annice Pih and wife to discuss goals of care.  They had an extensive  discussion regarding recent hx of progressive weakness, concern for dementia, probable chronic aspiration, current circumstances and organ failures. They also discussed the challenges that intubation in this setting poses and the risks involved.  After discussion with family and reviewing patient's prior wishes under circumstances such as this, the family has decided not to perform intubation and / or resuscitation if arrest were to occur, but to otherwise continue with current medical support / therapies. Disposition: Progressive.  Labs   CBC: Recent Labs  Lab 08/09/2020 1020 08/21/20 0150  WBC 10.5 13.5*  HGB 12.5* 11.5*  HCT 43.8 38.6*  MCV 96.7 96.3  PLT 229 212   Basic Metabolic Panel: Recent Labs  Lab 08/05/2020 1020 08/21/20 0150  NA 141 143  K 4.4 4.4  CL 100 99  CO2 32 35*  GLUCOSE 125* 115*  BUN 16 18  CREATININE 1.03 1.16  CALCIUM 8.6* 8.3*   GFR: Estimated Creatinine Clearance: 56.4 mL/min (by C-G formula based on SCr of 1.16 mg/dL). Recent Labs  Lab 08/15/2020 1020 08/14/2020 1154 08/21/20 0150  WBC 10.5  --  13.5*  LATICACIDVEN  --  1.4  --    Liver Function Tests: Recent Labs  Lab 08/10/2020 1020  AST 16  ALT 12  ALKPHOS 53  BILITOT 0.3  PROT 6.8  ALBUMIN 2.7*   Recent Labs  Lab 07/25/2020 1145  LIPASE 28   No results for input(s): AMMONIA in the last 168 hours. ABG    Component Value Date/Time   PHART 7.231 (L) 07/28/2020 1800   PCO2ART 86.7 (HH) 07/26/2020 1800   PO2ART 77.4 (L) 08/19/2020 1800   HCO3 35.0 (H) 08/22/2020 1800   O2SAT 93.0 08/18/2020 1800    Coagulation Profile: No results for input(s): INR, PROTIME in the last 168 hours. Cardiac Enzymes: No results for input(s): CKTOTAL, CKMB, CKMBINDEX, TROPONINI in the last 168 hours. HbA1C: No results found for: HGBA1C CBG: Recent Labs  Lab 08/07/2020 1759  GLUCAP 99    Steve Bryant Saye ACNP Acute Care Nurse Practitioner [facility] Please consult  Amion 08/21/2020, 9:32 AM

## 2020-08-21 NOTE — CV Procedure (Signed)
Venous Duplex completed  Results can be found under chart review under CV PROC. 08/21/2020 4:32 PM Pricsilla Lindvall RVT, RDMS

## 2020-08-21 NOTE — Progress Notes (Signed)
PCO2 66.3. NP paged.  Sabra Heck, RN

## 2020-08-21 NOTE — Evaluation (Signed)
Clinical/Bedside Swallow Evaluation Patient Details  Name: Brian Wiley MRN: [id number] Date of Birth: 1933-08-05  Today's Date: 08/21/2020 Time: SLP Start Time (ACUTE ONLY): 1413 SLP Stop Time (ACUTE ONLY): 1429 SLP Time Calculation (min) (ACUTE ONLY): 16 min  Past Medical History:  Past Medical History:  Diagnosis Date  . BPH (benign prostatic hyperplasia)   . CVA (cerebral vascular accident) (HCC) 1994  . HTN (hypertension)    Past Surgical History: History reviewed. No pertinent surgical history. HPI:  Pt is an 86 yo male presenting from SNF with AMS and hypoxia that started after breakfast with concern for aspiration. CTA chest showed multifocal R sided PNA. PMH includes: CVA (1994), HTN, suspected underlying dementia   Assessment / Plan / Recommendation Clinical Impression  Pt's oropharyngeal swallow intially appeared to be functional, but with ongoing challenging, pt started to have delayed coughing episodes. His cough sounded wet, but nothing was expectorated up to the level of his mouth. Pt self-reported feeling like something was coming back up into his throat. Question if his risk for aspiration is more post-prandial based on clinical results. Given decreased respiratory status, recommending proceeding with MBS before initiating full diet. Since this can be completed at the earliest tomorrow, would maintain NPO status except for providing meds crushed in puree and a few sips of water after oral care, maintaining an upright position after any intake. Would hold any POs if he has a further decline in respiratory function. SLP Visit Diagnosis: Dysphagia, unspecified (R13.10)    Aspiration Risk  Moderate aspiration risk    Diet Recommendation NPO except meds;Free water protocol after oral care (limit to a few sips of water at a time)   Liquid Administration via: Cup;Straw Medication Administration: Crushed with puree Supervision: Full supervision/cueing for compensatory  strategies Postural Changes: Remain upright for at least 30 minutes after po intake;Seated upright at 90 degrees    Other  Recommendations Oral Care Recommendations: Oral care QID;Oral care prior to ice chip/H20 Other Recommendations: Have oral suction available   Follow up Recommendations  (tba)      Frequency and Duration            Prognosis Prognosis for Safe Diet Advancement: Good Barriers to Reach Goals: Cognitive deficits      Swallow Study   General HPI: Pt is an 86 yo male presenting from SNF with AMS and hypoxia that started after breakfast with concern for aspiration. CTA chest showed multifocal R sided PNA. PMH includes: CVA (1994), HTN, suspected underlying dementia Type of Study: Bedside Swallow Evaluation Previous Swallow Assessment: none in chart Diet Prior to this Study: NPO Temperature Spikes Noted: No Respiratory Status: Nasal cannula History of Recent Intubation: No Behavior/Cognition: Alert;Cooperative;Requires cueing Oral Cavity Assessment: Within Functional Limits Oral Care Completed by SLP: No Oral Cavity - Dentition: Poor condition;Missing dentition Vision: Functional for self-feeding Self-Feeding Abilities: Needs assist Patient Positioning: Upright in bed Baseline Vocal Quality: Normal (slurred speech - per notes, appears to be baseline) Volitional Cough: Strong Volitional Swallow: Able to elicit    Oral/Motor/Sensory Function Overall Oral Motor/Sensory Function: Within functional limits   Ice Chips Ice chips: Not tested   Thin Liquid Thin Liquid: Impaired Presentation: Cup;Self Fed;Straw Pharyngeal  Phase Impairments: Cough - Delayed    Nectar Thick Nectar Thick Liquid: Not tested   Honey Thick Honey Thick Liquid: Not tested   Puree Puree: Within functional limits Presentation: Spoon   Solid     Solid: Impaired Presentation: Self Fed Oral Phase Impairments: Other (comment) (  prolonged mastication)      Mahala Menghini., M.A. CCC-SLP Acute  Rehabilitation Services Pager [phone] Office [phone]  08/21/2020,2:51 PM

## 2020-08-21 NOTE — Progress Notes (Signed)
Patient on BiPAP overnight.  CPT withheld.

## 2020-08-21 NOTE — Progress Notes (Signed)
Report take from lab PCO2 66.3 report given to [facility]. Oniya Mandarino, Randall An RN

## 2020-08-22 ENCOUNTER — Inpatient Hospital Stay (HOSPITAL_COMMUNITY): Payer: Medicare Other

## 2020-08-22 NOTE — Progress Notes (Signed)
Pt refused CPT and refused to keep BiPAP mask on.

## 2020-08-22 NOTE — Progress Notes (Signed)
Modified Barium Swallow Progress Note  Patient Details  Name: Brian Wiley MRN: [id number] Date of Birth: 1933-04-23  Today's Date: 08/22/2020  Modified Barium Swallow completed.  Full report located under Chart Review in the Imaging Section.  Brief recommendations include the following:  Clinical Impression  Pt demonstrates a mild pharyngeal and cervical esophageal dysphagia, accounting for clinical signs of dysphagia at bedside including wet vocal quality, belching and occasional throat clearing. Despite this pt did not aspirate during the study though he did cough often. Pts swallow characterized by premature spillage of bolus to pharynx with accumulation in the vallecuale and pyriforms and leading to flash penetration before the swallow (PAS 2). There is mild residue in primarily in the UES due to prominent cricopharyngeal muscle with mild backflow to pyriforms, though there is also some mild residue around the epiglottis and lateral channels. Pt clears with throat clearing and second swallows. Pills and solids passed through UES and esophageal sweep appeared WNL though no radiologist present to confirm. More significant dysphagia is possible with a large meal or suboptimal positioning. Will f/u for further bedside observation and discussion of basic precautions.   Swallow Evaluation Recommendations       SLP Diet Recommendations: Dysphagia 3 (Mech soft) solids;Thin liquid   Liquid Administration via: Cup;Straw   Medication Administration: Whole meds with liquid   Supervision: Patient able to self feed   Compensations: Slow rate;Small sips/bites;Follow solids with liquid;Multiple dry swallows after each bite/sip   Postural Changes: Seated upright at 90 degrees   Oral Care Recommendations: Oral care BID   Other Recommendations: Have oral suction available  Harlon Ditty, MA CCC-SLP  Acute Rehabilitation Services Pager [phone] Office [phone]   Claudine Mouton 08/22/2020,9:09 AM

## 2020-08-22 NOTE — Progress Notes (Signed)
NAME:  Brian Wiley, MRN:  [id number], DOB:  [birth year], LOS: 2 ADMISSION DATE:  08/17/2020, CONSULTATION DATE:  08/09/2020 REFERRING MD:  Katrinka Blazing  CHIEF COMPLAINT:  SOB, AMS   Brief History   Brian Wiley is a 86 y.o. male who was admitted with AMS and hypoxia likely felt to be 2/2 aspiration PNA. PCCM called evening 12/28 for worsening mental status 2/2 hypercapnia.  History of present illness   Pt is encephelopathic; therefore, this HPI is obtained from chart review. Brian Wiley is a 86 y.o. male who has a PMH including but not limited to prior CVA and suspected underlying dementia who resides at SNF (see "past medical history" for rest).  He presented to [facility] ED 12/28 with AMS and hypoxiafollowing breakfast.  It was felt that pt likely aspirated.  He was apparently admitted to hospital 05/2020 with acute encephalopathy felt to be 2/2 UTI and underlying dementia.  Following that admission, he was discharged to [facility] due to continued weakness.  In ED, he was found to have hypoxia requiring NRB.  CTA chest was negative for PE but did show multifocal right sided PNA likely 2/2 underlying aspiration.  He was admitted by [facility] and was placed on rocephin and azithromycin.  Later that afternoon, he had worsening mental status.  ABG showed acute (with likely some component chronic) hypoxic and hypercapnic respiratory failure.  Due to his AMS, PCCM was asked to see in consultation.  Past Medical History  has AKI (acute kidney injury) (HCC); Hypokalemia; Elevated troponin; Encephalopathy; Essential hypertension; Sepsis due to pneumonia (HCC); Acute respiratory failure with hypoxia (HCC); Protein calorie malnutrition (HCC); and Aspiration pneumonia (HCC) on their problem list.  Significant Hospital Events   12/28 > admit.  Consults:  PCCM.  Procedures:  None.  Significant Diagnostic Tests:  CTA chest 12/28 > neg for PE.  Multifocal right sided PNA.  Mild peripancreatic  stranding with underlying 2.2 cm pancreatic tail lesion.  Micro Data:  COVID 12/28 > neg. Flu 12/28 > neg. Blood 12/28 >   Antimicrobials:  Azithromycin 12/28 > 12/28. Ceftriaxone 12/28 > 12/28. Zosyn 12/28 > off  Interim history/subjective:  Past swallowing evaluation on dysphagia 3 diet pulmonary critical care will sign off Objective:  Blood pressure (!) 148/80, pulse 77, temperature 98.4 F (36.9 C), temperature source Oral, resp. rate 16, height 5\' 9"  (1.753 m), weight 108 kg, SpO2 95 %.        Intake/Output Summary (Last 24 hours) at 08/22/2020 1110 Last data filed at 08/22/2020 0555 Gross per 24 hour  Intake 250 ml  Output 250 ml  Net 0 ml   Filed Weights   08/01/2020 1835  Weight: 108 kg    Examination: General: Obese male no acute distress currently on room air HEENT: No JVD lymphadenopathy is appreciated Neuro: No focal defects but strange affect CV: Heart sounds are distant PULM: Diminished breath sounds throughout Currently on room air GI: soft, bsx4 active  Extremities: warm/dry, 1+ edema  Skin: no rashes or lesions   Assessment & Plan:   Acute hypoxic and hypercapnic respiratory failure.  Currently on room air with sats 90% Nocturnal BiPAP   Multifocal PNA - presumed 2/2 aspiration. Has been placed on dysphagia 3 diet Antibiotics have been discontinued  AMS - 2/2 above. (BiPAP nightly  Best Practice:  Diet: Dysphagia 3 diet Pain/Anxiety/Delirium protocol (if indicated): N/A. VAP protocol (if indicated): N/A. DVT prophylaxis: SCD's / Lovenox. GI prophylaxis: None. Glucose control: None. Mobility: Bedrest. Last date  of multidisciplinary goals of care discussion: None. Family and staff present: None. Summary of discussion: None. Follow up goals of care discussion due: 1/3. Code Status: DNR. Family Communication: Dr. Kemper Durie called daughter Annice Pih and wife to discuss goals of care.  They had an extensive discussion regarding recent hx of  progressive weakness, concern for dementia, probable chronic aspiration, current circumstances and organ failures. They also discussed the challenges that intubation in this setting poses and the risks involved.  After discussion with family and reviewing patient's prior wishes under circumstances such as this, the family has decided not to perform intubation and / or resuscitation if arrest were to occur, but to otherwise continue with current medical support / therapies. Disposition: Progressive.  08/22/2020 pulmonary critical care will sign off.  Labs   CBC: Recent Labs  Lab 2020/09/01 1020 08/21/20 0150  WBC 10.5 13.5*  HGB 12.5* 11.5*  HCT 43.8 38.6*  MCV 96.7 96.3  PLT 229 212   Basic Metabolic Panel: Recent Labs  Lab 2020/09/01 1020 08/21/20 0150  NA 141 143  K 4.4 4.4  CL 100 99  CO2 32 35*  GLUCOSE 125* 115*  BUN 16 18  CREATININE 1.03 1.16  CALCIUM 8.6* 8.3*   GFR: Estimated Creatinine Clearance: 56.4 mL/min (by C-G formula based on SCr of 1.16 mg/dL). Recent Labs  Lab 09-01-20 1020 01-Sep-2020 1154 08/21/20 0150  WBC 10.5  --  13.5*  LATICACIDVEN  --  1.4  --    Liver Function Tests: Recent Labs  Lab Sep 01, 2020 1020  AST 16  ALT 12  ALKPHOS 53  BILITOT 0.3  PROT 6.8  ALBUMIN 2.7*   Recent Labs  Lab 09/01/20 1145  LIPASE 28   No results for input(s): AMMONIA in the last 168 hours. ABG    Component Value Date/Time   PHART 7.340 (L) 08/21/2020 0953   PCO2ART 66.3 (HH) 08/21/2020 0953   PO2ART 131 (H) 08/21/2020 0953   HCO3 34.9 (H) 08/21/2020 0953   O2SAT 98.7 08/21/2020 0953    Coagulation Profile: No results for input(s): INR, PROTIME in the last 168 hours. Cardiac Enzymes: No results for input(s): CKTOTAL, CKMB, CKMBINDEX, TROPONINI in the last 168 hours. HbA1C: No results found for: HGBA1C CBG: Recent Labs  Lab 09-01-20 1759  GLUCAP 99    Steve Tytianna Greenley ACNP Acute Care Nurse Practitioner [facility]/Critical [facility] Please consult  Amion 08/22/2020, 11:10 AM

## 2020-08-22 NOTE — Evaluation (Signed)
Occupational Therapy Evaluation Patient Details Name: Brian Wiley MRN: [id number] DOB: January 29, 1933 Today's Date: 08/22/2020    History of Present Illness Brian Wiley is a 86 y.o. male who was admitted with AMS and hypoxia likely felt to be 2/2 aspiration PNA.  PCCM called evening 12/28 for worsening mental status 2/2 hypercapnia. Pt resides at [name].   Clinical Impression   Patient admitted from his SNF for the above diagnosis.  Patient receives near total care for bedlevel ADLs, and is a hoyer lift for transfers.  OT recommends return to SNF for assist as needed per care plan developed.  Recommend patient out of bed in recliner via hoyer lift for meals.  No further OT recommended in the acute setting.      Follow Up Recommendations  SNF    Equipment Recommendations       Recommendations for Other Services       Precautions / Restrictions Precautions Precautions: Fall Restrictions Weight Bearing Restrictions: No      Mobility Bed Mobility Overal bed mobility: Needs Assistance Bed Mobility: Supine to Sit     Supine to sit: Max assist;+2 for physical assistance;HOB elevated     General bed mobility comments: Needed assist to come to eOB for LES and for trunk. Incr time for pt to scoot to EOB with use of pad.    Transfers Overall transfer level: Needs assistance Equipment used: 2 person hand held assist Transfers: Sit to/from Stand Sit to Stand: Total assist;+2 physical assistance;From elevated surface         General transfer comment: Pt barely cleared buttocks off bed with 2 person total assist attempt to stand. Determined it best to lie pt back down.    Balance Overall balance assessment: Needs assistance Sitting-balance support: Bilateral upper extremity supported;Feet supported Sitting balance-Leahy Scale: Poor Sitting balance - Comments: mod assist progressing to min guard assist with pt sitting for up to 6 minutes EOB                                    ADL either performed or assessed with clinical judgement   ADL Overall ADL's : At baseline                                       General ADL Comments: Patient has assist as needed from SNF staff for bathing, dressing, toileting via hoyer to [facility], dependent for meds.     Vision Patient Visual Report: No change from baseline       Perception     Praxis      Pertinent Vitals/Pain Pain Assessment: No/denies pain     Hand Dominance     Extremity/Trunk Assessment Upper Extremity Assessment Upper Extremity Assessment: Generalized weakness   Lower Extremity Assessment Lower Extremity Assessment: Defer to PT evaluation   Cervical / Trunk Assessment Cervical / Trunk Assessment: Normal   Communication Communication Communication: Expressive difficulties   Cognition Arousal/Alertness: Awake/alert Behavior During Therapy: Restless;Anxious Overall Cognitive Status: History of cognitive impairments - at baseline                                 General Comments: Dementia per chart   General Comments  Sats on arrival were 86% on RA - pt had  removed his O2. REplaced O2 at 5L and sats >90%.  Other VSs.    Exercises     Shoulder Instructions      Home Living Family/patient expects to be discharged to:: Skilled nursing facility                                        Prior Functioning/Environment Level of Independence: Needs assistance  Gait / Transfers Assistance Needed: Pt states nursing home used lift to get him to chair. ADL's / Homemaking Assistance Needed: Patient stated staff bathed him in bed Communication / Swallowing Assistance Needed: slurred speech          OT Problem List: Decreased cognition      OT Treatment/Interventions:      OT Goals(Current goals can be found in the care plan section) Acute Rehab OT Goals Patient Stated Goal: none verbalized OT Goal Formulation: Patient  unable to participate in goal setting Time For Goal Achievement: 08/22/20  OT Frequency:     Barriers to D/C:    none noted       Co-evaluation PT/OT/SLP Co-Evaluation/Treatment: Yes Reason for Co-Treatment: Complexity of the patient's impairments (multi-system involvement) PT goals addressed during session: Mobility/safety with mobility OT goals addressed during session: ADL's and self-care      AM-PAC OT "6 Clicks" Daily Activity     Outcome Measure Help from another person eating meals?: A Little Help from another person taking care of personal grooming?: A Lot Help from another person toileting, which includes using toliet, bedpan, or urinal?: Total Help from another person bathing (including washing, rinsing, drying)?: Total Help from another person to put on and taking off regular upper body clothing?: A Lot Help from another person to put on and taking off regular lower body clothing?: Total 6 Click Score: 10   End of Session Equipment Utilized During Treatment: Gait belt;Oxygen Nurse Communication: Mobility status  Activity Tolerance: Patient limited by fatigue Patient left: in bed;with call bell/phone within reach;with nursing/sitter in room  OT Visit Diagnosis: Other symptoms and signs involving cognitive function                Time: 8416-6063 OT Time Calculation (min): 13 min Charges:  OT General Charges $OT Visit: 1 Visit OT Evaluation $OT Eval Moderate Complexity: 1 Mod  08/22/2020  Rich, OTR/L  Acute Rehabilitation Services  Office:  [phone]   Suzanna Obey 08/22/2020, 2:20 PM

## 2020-08-22 NOTE — Evaluation (Signed)
Physical Therapy Evaluation Patient Details Name: Brian Wiley MRN: [id number] DOB: October 20, 1932 Today's Date: 08/22/2020   History of Present Illness  Brian Wiley is a 86 y.o. male who was admitted with AMS and hypoxia likely felt to be 2/2 aspiration PNA.  PCCM called evening 12/28 for worsening mental status 2/2 hypercapnia. Pt resides at [facility].  Clinical Impression  Pt admitted with above diagnosis. Pt was able to transition to EOB with max to total assist of 2 staff.  Pt sat EOB 6 minutes with mod assist iniitally progressing to min guard assist. Pt from SNF and will need to return once medically stable. Will follow acutely.  Pt currently with functional limitations due to the deficits listed below (see PT Problem List). Pt will benefit from skilled PT to increase their independence and safety with mobility to allow discharge to the venue listed below.      Follow Up Recommendations SNF;Supervision/Assistance - 24 hour    Equipment Recommendations  None recommended by PT    Recommendations for Other Services       Precautions / Restrictions Precautions Precautions: Fall Restrictions Weight Bearing Restrictions: No      Mobility  Bed Mobility Overal bed mobility: Needs Assistance Bed Mobility: Supine to Sit     Supine to sit: Max assist;+2 for physical assistance;HOB elevated     General bed mobility comments: Needed assist to come to eOB for LES and for trunk. Incr time for pt to scoot to EOB with use of pad.    Transfers Overall transfer level: Needs assistance Equipment used: 2 person hand held assist Transfers: Sit to/from Stand Sit to Stand: Total assist;+2 physical assistance;From elevated surface         General transfer comment: Pt barely cleared buttocks off bed with 2 person total assist attempt to stand. Determined it best to lie pt back down.  Ambulation/Gait             General Gait Details: Pt unable  Stairs             Wheelchair Mobility    Modified Rankin (Stroke Patients Only)       Balance Overall balance assessment: Needs assistance Sitting-balance support: Bilateral upper extremity supported;Feet supported Sitting balance-Leahy Scale: Poor Sitting balance - Comments: mod assist progressing to min guard assist with pt sitting for up to 6 minutes EOB                                     Pertinent Vitals/Pain Pain Assessment: No/denies pain    Home Living Family/patient expects to be discharged to:: Skilled nursing facility                      Prior Function Level of Independence: Needs assistance   Gait / Transfers Assistance Needed: Pt states nursing home used lift to get him to chair.  ADL's / Homemaking Assistance Needed: States he had assist        Hand Dominance        Extremity/Trunk Assessment   Upper Extremity Assessment Upper Extremity Assessment: Defer to OT evaluation    Lower Extremity Assessment Lower Extremity Assessment: Generalized weakness    Cervical / Trunk Assessment Cervical / Trunk Assessment: Normal  Communication   Communication: Expressive difficulties  Cognition Arousal/Alertness: Awake/alert Behavior During Therapy: Restless Overall Cognitive Status: History of cognitive impairments - at baseline  General Comments: Dementia per chart      General Comments General comments (skin integrity, edema, etc.): Sats on arrival were 86% on RA - pt had removed his O2. REplaced O2 at 5L and sats >90%.  Other VSs.    Exercises     Assessment/Plan    PT Assessment Patient needs continued PT services  PT Problem List Decreased balance;Decreased mobility;Decreased activity tolerance;Decreased strength;Decreased knowledge of use of DME;Decreased safety awareness;Decreased cognition;Decreased knowledge of precautions;Cardiopulmonary status limiting activity;Obesity       PT  Treatment Interventions DME instruction;Functional mobility training;Therapeutic activities;Therapeutic exercise;Balance training;Patient/family education    PT Goals (Current goals can be found in the Care Plan section)  Acute Rehab PT Goals Patient Stated Goal: to go home PT Goal Formulation: With patient Time For Goal Achievement: 09/05/20 Potential to Achieve Goals: Good    Frequency Min 2X/week   Barriers to discharge        Co-evaluation PT/OT/SLP Co-Evaluation/Treatment: Yes Reason for Co-Treatment: Complexity of the patient's impairments (multi-system involvement);For patient/therapist safety PT goals addressed during session: Mobility/safety with mobility         AM-PAC PT "6 Clicks" Mobility  Outcome Measure Help needed turning from your back to your side while in a flat bed without using bedrails?: A Lot Help needed moving from lying on your back to sitting on the side of a flat bed without using bedrails?: A Lot Help needed moving to and from a bed to a chair (including a wheelchair)?: Total Help needed standing up from a chair using your arms (e.g., wheelchair or bedside chair)?: Total Help needed to walk in hospital room?: Total Help needed climbing 3-5 steps with a railing? : Total 6 Click Score: 8    End of Session Equipment Utilized During Treatment: Gait belt;Oxygen Activity Tolerance: Patient limited by fatigue Patient left: in bed;with call bell/phone within reach;with bed alarm set;with nursing/sitter in room Nurse Communication: Mobility status;Need for lift equipment PT Visit Diagnosis: Unsteadiness on feet (R26.81);Muscle weakness (generalized) (M62.81)    Time: 5681-2751 PT Time Calculation (min) (ACUTE ONLY): 20 min   Charges:   PT Evaluation $PT Eval Moderate Complexity: 1 Mod          Hagop Mccollam W,PT Acute Rehabilitation Services Pager:  [phone]  Office:  [phone]    Berline Lopes 08/22/2020, 12:41 PM

## 2020-08-23 LAB — BASIC METABOLIC PANEL
Anion gap: 7 (ref 5–15)
BUN: 20 mg/dL (ref 8–23)
CO2: 35 mmol/L — ABNORMAL HIGH (ref 22–32)
Calcium: 8.3 mg/dL — ABNORMAL LOW (ref 8.9–10.3)
Chloride: 102 mmol/L (ref 98–111)
Creatinine, Ser: 1.13 mg/dL (ref 0.61–1.24)
GFR, Estimated: >60 mL/min (ref >60)
Glucose, Bld: 124 mg/dL — ABNORMAL HIGH (ref 70–99)
Potassium: 4 mmol/L (ref 3.5–5.1)
Sodium: 144 mmol/L (ref 135–145)

## 2020-08-23 NOTE — TOC Progression Note (Signed)
Transition of Care [facility]) - Progression Note    Patient Details  Name: Brian Wiley MRN: [id number] Date of Birth: 1933/04/30  Transition of Care [facility]) CM/SW Contact  Carmina Miller, LCSWA Phone Number: 08/23/2020, 2:19 PM  Clinical Narrative:    CSW left a vm on daughter's phone in reference to pt returning back to [facility].         Expected Discharge Plan and Services                                                 Social Determinants of Health (SDOH) Interventions    Readmission Risk Interventions No flowsheet data found.

## 2020-08-23 NOTE — Care Management Important Message (Signed)
Important Message  Patient Details  Name: Brian Wiley MRN: [id number] Date of Birth: 1933/04/18   Medicare Important Message Given:  Yes     Qunisha Bryk Stefan Church 08/23/2020, 2:50 PM

## 2020-08-23 NOTE — Progress Notes (Signed)
Pharmacy Antibiotic Note  Brian Wiley is a 86 y.o. male admitted on 2020/09/02 with respiratory distress.  Continues on Zosyn - day 4 Ongoing confusion Cultures negative to date Zithromax ends 1/1  Plan: Continue Zosyn 3.375 gm IV Q 8 hrs (extended infusion) - added 7 day stop date Pharmacy will sign off  Total Body Weight: 108 kg Height: 69 inches  Temp (24hrs), Avg:98.1 F (36.7 C), Min:97.6 F (36.4 C), Max:98.7 F (37.1 C)  Recent Labs  Lab 09/02/20 1020 09/02/2020 1154 08/21/20 0150 08/23/20 0021  WBC 10.5  --  13.5*  --   CREATININE 1.03  --  1.16 1.13  LATICACIDVEN  --  1.4  --   --     Estimated Creatinine Clearance: 57.9 mL/min (by C-G formula based on SCr of 1.13 mg/dL).    No Known Allergies  Thank you for allowing pharmacy to be a part of this patient's care. Okey Regal, PharmD 08/23/2020 8:57 AM

## 2020-08-24 DIAGNOSIS — J9601 Acute respiratory failure with hypoxia: Secondary | ICD-10-CM | POA: Diagnosis not present

## 2020-08-24 LAB — BLOOD GAS, ARTERIAL
Acid-Base Excess: 8.8 mmol/L — ABNORMAL HIGH (ref 0.0–2.0)
Bicarbonate: 36 mmol/L — ABNORMAL HIGH (ref 20.0–28.0)
Drawn by: 275531
FIO2: 45
O2 Saturation: 96.1 %
Patient temperature: 36.6
pCO2 arterial: 83.8 mmHg (ref 32.0–48.0)
pH, Arterial: 7.253 — ABNORMAL LOW (ref 7.350–7.450)
pO2, Arterial: 85.5 mmHg (ref 83.0–108.0)

## 2020-08-24 MED ORDER — HALOPERIDOL LACTATE 5 MG/ML IJ SOLN: 1.0000 mg | Freq: Four times a day (QID) | INTRAMUSCULAR | Status: DC | PRN

## 2020-08-24 MED ORDER — METHYLPREDNISOLONE SODIUM SUCC 125 MG IJ SOLR
60.0000 mg | Freq: Every day | INTRAMUSCULAR | Status: DC
  Administered 2020-08-24: 60 mg via INTRAVENOUS
  Filled 2020-08-24: qty 2

## 2020-08-24 NOTE — Progress Notes (Addendum)
PROGRESS NOTE  Brian Wiley EXH:[id number] DOB: Apr 25, 1933 DOA: 07/27/2020 PCP: Patient, [name]  Brief History   Brian Wiley is a 87 y.o. male who was admitted with AMS and hypoxia likely felt to be 2/2 aspiration PNA. PCCM called evening 12/28 for worsening mental status 2/2 hypercapnia. This morning the patient's oxygen requirements have increased to 15L by Venti mask.   The patient is receiving Zosyn, and albuterol.   Consultants  . PCCM  Procedures  . None  Antibiotics   Anti-infectives (From admission, onward)   Start     Dose/Rate Route Frequency Ordered Stop   08/21/20 1200  cefTRIAXone (ROCEPHIN) 2 g in sodium chloride 0.9 % 100 mL IVPB  Status:  Discontinued        2 g 200 mL/hr over 30 Minutes Intravenous Every 24 hours 08/02/2020 1443 08/03/2020 1840   08/21/20 1200  azithromycin (ZITHROMAX) 500 mg in sodium chloride 0.9 % 250 mL IVPB  Status:  Discontinued        500 mg 250 mL/hr over 60 Minutes Intravenous Every 24 hours 07/29/2020 1443 07/29/2020 1840   08/21/20 1200  azithromycin (ZITHROMAX) 500 mg in sodium chloride 0.9 % 250 mL IVPB        500 mg 250 mL/hr over 60 Minutes Intravenous Every 24 hours 08/11/2020 1916 08/24/20 1223   08/08/2020 1930  piperacillin-tazobactam (ZOSYN) IVPB 3.375 g        3.375 g 12.5 mL/hr over 240 Minutes Intravenous Every 8 hours 08/07/2020 1853 Sep 23, 2020 2359   08/08/2020 1200  cefTRIAXone (ROCEPHIN) 1 g in sodium chloride 0.9 % 100 mL IVPB        1 g 200 mL/hr over 30 Minutes Intravenous  Once 08/19/2020 1154 08/13/2020 1311   07/27/2020 1200  azithromycin (ZITHROMAX) 500 mg in sodium chloride 0.9 % 250 mL IVPB        500 mg 250 mL/hr over 60 Minutes Intravenous  Once 08/02/2020 1154 08/06/2020 1328    .   Subjective  The patient is resting comfortably on Venti mask. No new complaints.  Objective   Vitals:  Vitals:   08/24/20 0807 08/24/20 1142  BP: (!) 169/98 (!) 161/94  Pulse: 92 82  Resp: 20 18  Temp: 98.1 F (36.7 C) 97.9 F (36.6  C)  SpO2: 100% 96%    Exam:  Constitutional:  . The patient is somnolent, but easily rousable. No acute distress. Respiratory:  . No increased work of breathing. . No wheezes, rales, or rhonchi . No tactile fremitus Cardiovascular:  . Regular rate and rhythm . No murmurs, ectopy, or gallups. . No lateral PMI. No thrills. Abdomen:  . Abdomen is soft, non-tender, non-distended . No hernias, masses, or organomegaly . Normoactive bowel sounds.  Musculoskeletal:  . No cyanosis, clubbing, or edema Skin:  . No rashes, lesions, ulcers . palpation of skin: no induration or nodules Neurologic:  . CN 2-12 intact . Sensation all 4 extremities intact Psychiatric:  . Mental status o Mood, affect appropriate o Orientation to person, place, time  . judgment and insight appear intact  I have personally reviewed the following:   Today's Data  . Vitals, BMP  Micro Data  . Blood culture x 2: No growth  Imaging  . CXR  Scheduled Meds: . chlorhexidine  15 mL Mouth Rinse BID  . enoxaparin (LOVENOX) injection  40 mg Subcutaneous Q24H  . mouth rinse  15 mL Mouth Rinse q12n4p   Continuous Infusions: . sodium chloride 75 mL/hr  at 08/24/20 0905  . piperacillin-tazobactam (ZOSYN)  IV 3.375 g (08/24/20 1325)    Principal Problem:   Acute respiratory failure with hypoxia (HCC) Active Problems:   Elevated troponin   Encephalopathy   Sepsis due to pneumonia (HCC)   Protein calorie malnutrition (HCC)   Aspiration pneumonia (HCC)   LOS: 4 days    A & P  Acute respiratory failure with hypoxia: The patient is receiving nebulizer treatments on an as needed basis, Zosyn, and BIPAP. The patient's oxygen requirements are decreasing. Will recheck ABG.  Aspiration pneumonia: The patient is receiving zosyn for treatment. Monitor.  Sepsis: Due to pneumonia. Resolved.   Protein calorie malnutrition: Nutrition consult.  Encephalopathy: The patient remains confused and disoriented.   I  have seen and examined this patient myself. I have spent 32 minutes in his evaluation and care.  DVT Prophylaxis: Lovenox CODE STATUS: DNR Family Communication: None available Disposition:  Status is: Inpatient  Remains inpatient appropriate because:Inpatient level of care appropriate due to severity of illness  Dispo: The patient is from: SNF              Anticipated d/c is to: SNF              Anticipated d/c date is: 2 days              Patient currently is not medically stable to d/c.   Lancer Thurner, DO [facility] Direct contact: see [email or web address]  7PM-7AM contact night coverage as above 08/24/2020, 3:58 PM  LOS: 4 days   ADDENDUM: Follow up ABG demonstrated a pH is 7.253, PCO2 83.8, PaO2 85.5, and SaO2 of 96%. I have discussed the patient with nursing. The patient already has orders for BIPAP. He will be put back on BIPAP, if he allows. Nursing contacted RT to place patient back on BIPAP with goal SaO2 of 90-94%. I received a communication back from RT that the patient was not a candidate for BIPAP due to restraints and his secretions. I spoke with nursing again and asked them to reduce his FIO2 and to have a goal SaO2 of 90%-94%. An order was placed to this effect. I then called the patient's step-daughter Annice Pih and explained the situation to her. She got the patient's wife on the phone and the patient's code status was clarified as DNR. The family understood that this was "nuanced". I explained that it was not. They confirmed that this is what they wanted. The patient has a poor prognosis. I have consulted palliative care to assist the family in addressing their goals of care.

## 2020-08-24 NOTE — Progress Notes (Signed)
Patient restless pulled condom cath. Off pulled O2 off and pulled dressing around I.V. off. Incont. Of urine in bed. Talking a lot, wanting water. Gave Ice chips per report to decrease p.o. intake due to increase mucus and F.M. on for O2. Patient constantly pulling o2 mask off Sats down to 80%. NO pain when ask . Clean patient, turn patient, mouth care attempted patient refusing by clamping down on swab. Emotional support given. Tim R.N. aware. Will page M.D. on call for soft wrist restrains in order to keep F.M. on. See new orders from M.D. Also Resp. There. Called to eval . patient and oxygen . Music on to help relax patient. Cont. To monitor patient freq.

## 2020-08-24 NOTE — Progress Notes (Signed)
Attempted to call Daughter Juanda Chance to let her know father had soft wrist restrains on. No answer left message to call me.

## 2020-08-24 NOTE — Progress Notes (Signed)
Cross-coverage note:   Patient confused, removing supplemental oxygen, unable to be redirected. No sitters available. Plan to use soft restraints for patient safety, reassess frequently, and remove as soon as possible.

## 2020-08-24 NOTE — Progress Notes (Signed)
CPT not done due to pt being agitated. Pt currently in bilateral wrist restraints with safety mittens. O2 increased to 35% VM with SpO2 94%. RT will continue to monitor.

## 2020-08-24 NOTE — Progress Notes (Signed)
RT spoke to Dr. Gerri Lins over the phone regarding critical value ABG results. MD wanted to place patient on BIPAP. RT expressed concerns / contraindications for BIPAP. Those concerns were secretions, mittens & wrist restraints. No new orders were given.

## 2020-08-24 NOTE — Progress Notes (Signed)
Soft wrist restrain applied to keep o2 on. And protect equipment. Cont. To monitor patient.

## 2020-08-24 DEATH — deceased

## 2020-08-25 DIAGNOSIS — G9340 Encephalopathy, unspecified: Secondary | ICD-10-CM | POA: Diagnosis not present

## 2020-08-25 DIAGNOSIS — R1319 Other dysphagia: Secondary | ICD-10-CM

## 2020-08-25 DIAGNOSIS — J9601 Acute respiratory failure with hypoxia: Secondary | ICD-10-CM | POA: Diagnosis not present

## 2020-08-25 DIAGNOSIS — R778 Other specified abnormalities of plasma proteins: Secondary | ICD-10-CM | POA: Diagnosis not present

## 2020-08-25 LAB — CULTURE, BLOOD (ROUTINE X 2)
Culture: NO GROWTH
Culture: NO GROWTH
Special Requests: ADEQUATE
Special Requests: ADEQUATE

## 2020-08-25 LAB — CBC WITH DIFFERENTIAL/PLATELET
Abs Immature Granulocytes: 0.07 10*3/uL (ref 0.00–0.07)
Basophils Absolute: 0 10*3/uL (ref 0.0–0.1)
Basophils Relative: 0 %
Eosinophils Absolute: 0 10*3/uL (ref 0.0–0.5)
Eosinophils Relative: 0 %
HCT: 38.5 % — ABNORMAL LOW (ref 39.0–52.0)
Hemoglobin: 11 g/dL — ABNORMAL LOW (ref 13.0–17.0)
Immature Granulocytes: 1 %
Lymphocytes Relative: 13 %
Lymphs Abs: 0.7 10*3/uL (ref 0.7–4.0)
MCH: 28.9 pg (ref 26.0–34.0)
MCHC: 28.6 g/dL — ABNORMAL LOW (ref 30.0–36.0)
MCV: 101.3 fL — ABNORMAL HIGH (ref 80.0–100.0)
Monocytes Absolute: 0.2 10*3/uL (ref 0.1–1.0)
Monocytes Relative: 3 %
Neutro Abs: 4.8 10*3/uL (ref 1.7–7.7)
Neutrophils Relative %: 83 %
Platelets: 134 10*3/uL — ABNORMAL LOW (ref 150–400)
RBC: 3.8 MIL/uL — ABNORMAL LOW (ref 4.22–5.81)
RDW: 13.9 % (ref 11.5–15.5)
WBC: 5.8 10*3/uL (ref 4.0–10.5)
nRBC: 0 % (ref 0.0–0.2)

## 2020-08-25 LAB — BASIC METABOLIC PANEL
Anion gap: 12 (ref 5–15)
BUN: 11 mg/dL (ref 8–23)
CO2: 31 mmol/L (ref 22–32)
Calcium: 8.5 mg/dL — ABNORMAL LOW (ref 8.9–10.3)
Chloride: 102 mmol/L (ref 98–111)
Creatinine, Ser: 0.98 mg/dL (ref 0.61–1.24)
GFR, Estimated: >60 mL/min (ref >60)
Glucose, Bld: 119 mg/dL — ABNORMAL HIGH (ref 70–99)
Potassium: 4.5 mmol/L (ref 3.5–5.1)
Sodium: 145 mmol/L (ref 135–145)

## 2020-08-25 MED ORDER — GLYCOPYRROLATE 0.2 MG/ML IJ SOLN
0.4000 mg | INTRAMUSCULAR | Status: DC
  Administered 2020-08-25 – 2020-08-26 (×5): 0.4 mg via INTRAVENOUS
  Filled 2020-08-25 (×5): qty 2

## 2020-08-25 MED ORDER — BISACODYL 10 MG RE SUPP: 10.0000 mg | Freq: Every day | RECTAL | Status: DC | PRN

## 2020-08-25 MED ORDER — LORAZEPAM 2 MG/ML IJ SOLN
0.5000 mg | INTRAMUSCULAR | Status: DC | PRN
  Administered 2020-08-25 – 2020-08-26 (×2): 1 mg via INTRAVENOUS
  Filled 2020-08-25 (×2): qty 1

## 2020-08-25 MED ORDER — ONDANSETRON 4 MG PO TBDP: 4.0000 mg | ORAL_TABLET | Freq: Four times a day (QID) | ORAL | Status: DC | PRN

## 2020-08-25 MED ORDER — MORPHINE SULFATE (PF) 2 MG/ML IV SOLN: 1.0000 mg | INTRAVENOUS | Status: DC | PRN

## 2020-08-25 MED ORDER — ALBUTEROL SULFATE (2.5 MG/3ML) 0.083% IN NEBU
2.5000 mg | INHALATION_SOLUTION | RESPIRATORY_TRACT | Status: DC | PRN
Start: 2020-08-25 — End: 2020-08-27

## 2020-08-25 MED ORDER — ONDANSETRON HCL 4 MG/2ML IJ SOLN: 4.0000 mg | Freq: Four times a day (QID) | INTRAMUSCULAR | Status: DC | PRN

## 2020-08-25 MED ORDER — MORPHINE SULFATE (PF) 2 MG/ML IV SOLN
1.0000 mg | INTRAVENOUS | Status: DC | PRN
  Administered 2020-08-26: 1 mg via INTRAVENOUS
  Filled 2020-08-25: qty 1

## 2020-08-25 NOTE — TOC Progression Note (Signed)
Transition of Care [facility]) - Progression Note    Patient Details  Name: Brian Wiley MRN: [id number] Date of Birth: 03-26-1933  Transition of [facility]) CM/SW Contact  Patrice [place], [place] Phone Number: [phone] 08/25/2020, 8:15 AM  Clinical Narrative:     CSW reached out to [facility] and authorization is still pending.  TOC team will continue to assist with discharge planning needs.       Expected Discharge Plan and Services                                                 Social Determinants of Health (SDOH) Interventions    Readmission Risk Interventions No flowsheet data found.

## 2020-08-25 NOTE — Consult Note (Cosign Needed Addendum)
Palliative Medicine Inpatient Consult Note  Reason for consult:  Goals of Care "Pt is hypercarbic, DNR, and not a candidate for BIPAP. Is comfort care an option?"  HPI:  Per intake H&P --> Brian Wiley is a 87 y.o. 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 PCCM called evening 12/28 for worsening mental status 2/2 hypercapnia. This morning the patient's oxygen requirements have increased to 15L by Venti mask.   Palliative care was asked to get involved in the setting of worsening aspiration pneumonia. Patient on zosyn presently. Has complicated delirium which is worsening the clinical picture as patient has not been able to recognize the need to keep O2 on. He is not a candidate for bipap in the setting of AMS and aspirational events.   Clinical Assessment/Goals of Care: I have reviewed medical records including EPIC notes, labs and imaging, received report from bedside RN, assessed the patient who was lying in bed on 10LPM [place], not in distress at my time of assessment.   Chart review: [name] came from [facility] home. He had moved to [facility] from [place] two months ago. Since relocating, he has not been doing well and has continued declines physically and cognitively. He had been hospitalized in the setting of a UTI previously and went to [facility] where he did not have great strides from a recovery perspective and transitioned to being a long term resident.    I called [name] (daughter) and [name] (spouse) to further discuss diagnosis prognosis, GOC, EOL wishes, disposition and options.   I introduced Palliative Medicine as specialized medical care for people living with serious illness. It focuses on providing relief from the symptoms and stress of a serious illness. The goal is to improve quality of life for both the  patient and the family.  [name] is from [place] originally. He moved to [facility] in the 1950's. He was married there and had three children. He later divorced and remarried his current wife, [name] who he has been with for the past fifty seven years. He is estranged from his three children from his first marriage. His daughter, [name] is very involved in his life - she is from his second marriage. He use to work in [employer]. He is a man who gets enjoyment of watching Country Western films.  Prior to hospitalization per conversation with [facility] he was able to self propell in [facility], was alert and oriented, would sundown sometimes, he fed himself though needed assistance with dressing, showering, moving from one place to another. Per discussion with nursing staff he was not coughing with ingestion.  A detailed discussion was had today regarding advanced directives - patient has never completed these per [name] and [name] though they make joint decisions on Corley behalf.   Concepts specific to code status, artifical feeding and hydration, continued IV antibiotics and rehospitalization was had.  Patient has a GOLD DNAR on file which I confirmed with his family. He is a DNAR/DNI code status.  The difference between a aggressive medical intervention path  and a palliative comfort care path for this patient at this time was had.I did explain the "what if's" of [place] situation. We discussed the hope for improvement and the reality that he may worsen over time and have recurrent re-hospitalizations, aspirations, and infections events. I introduced if we do get to a point  where symptom relief is thought to be more beneficial than aggressive care then hospice would be a reasonable consideration.  I described hospice as a service for patients for have a life expectancy of < 6 months. It preserves dignity and quality at the end phases of life. The focus changes from curative to symptom relief.    Patients family express that they feel confused as they have heard four different stories about [name]'s present health state. They recounted each conversation and expressed receiving conflicting information. We reviewed that he has suffered a significant aspiration event and has complicated delirium. I shared that the hope would be for improvement though he is in a tenuous state and certainly could experience a rapid decline. We further review the decline he has experienced over the past five years and marked decline in the past two months. I shared that we try to look at all functions of being to help surmise what the best next steps would be. They expressed understanding of this. Their issue was more so associated with prior information that was provided which I was unable clarify given I did not have those conversations. We discussed having Dr. [name] reach out to them to help offer clarity. I shared that the PMT will remain involved to support [name] for better or worse.   Patient family was very thankful of our conversations this morning.   Discussed the importance of continued conversation with family and their  medical providers regarding overall plan of care and treatment options, ensuring decisions are within the context of the patients values and GOCs.  Decision Maker: [name] (daughter) [phone]  SUMMARY OF RECOMMENDATIONS   DNAR/DNI  Continue current measures to evaluate for improvements, if no improvements are made family is open to additional conversations on changing focus  Patient would be a good candidate for OP palliative support  Appreciate Dr. [name] calling patients family to provide a comprehensive medical update. Patients family have felt that they are getting conflicting information.  Ongoing palliative support  Please see addendum as below  Code Status/Advance Care Planning: DNAR/DNI  Palliative Prophylaxis:   Oral care, Turn Q2H, Delirium  precuations  Additional Recommendations (Limitations, Scope, Preferences):  Continue current level of care   Psycho-social/Spiritual:   Desire for further Chaplaincy support: No  Additional Recommendations: Education on chronic disease processes   Prognosis: Recurrent hospitalizations and ER visits   Discharge Planning: Discharge will be back to [facility] nursing home  Vitals:   08/25/20 0245 08/25/20 0300  BP: (!) 156/86 (!) 163/97  Pulse:  83  Resp:  20  Temp:  99.1 F (37.3 C)  SpO2: 92% 94%    Intake/Output Summary (Last 24 hours) at 08/25/2020 8250 Last data filed at 08/25/2020 5397 Gross per 24 hour  Intake 1000 ml  Output 2201 ml  Net -1201 ml   Last Weight  Most recent update: 09/23/2020  6:44 PM   Weight  108 kg (238 lb 1.6 oz)            Gen:  Elderly M in NAD HEENT: Dry mucous membranes CV: Regular rate and rhythm  PULM: 10LPM [place], (+) diffuse rhonchi ABD: soft/nontender  EXT: No edema  Neuro: Somnolent  PPS: 30%   This conversation/these recommendations were discussed with patient primary care team, Dr. [name]  Time In: 1000 Time Out: 1120 Total Time: 80 Greater than 50%  of this time was spent counseling and coordinating care related to the above assessment and plan. ____________________________________________________________ Addendum:  Dr. [name] spoke to [facility] family and the decision was made to transition focus to comfort care. Medication adjustments were made per primary team. I spoke to patient bedside RN, Misty Stanley given his recurrent aspiration event she expressed apprehensions in feeding him. I shared that if he is alert he can have comfort feeds. We discussed that often its purely the sensation of feeling a substance in your mouth which causes a comforting feeling of satiety. I recommended have oral suction equipment at bedside should it be needed.   Lamarr Lulas [place] Palliative Medicine Team Team Cell Phone:  [phone] Please utilize secure chat with additional questions, if there is no response within 30 minutes please call the above phone number  Palliative Medicine Team providers are available by phone from 7am to 7pm daily and can be reached through the team cell phone.  Should this patient require assistance outside of these hours, please call the patient's attending physician.

## 2020-08-25 NOTE — Progress Notes (Signed)
D.C. soft wrist restrains. Patient  relax and not pulling on tubes or lines. Mittens on for safety.

## 2020-08-25 NOTE — Progress Notes (Signed)
[facility]  PROGRESS NOTE  Brian Wiley ZOX:[id number] DOB: Aug 27, 1932 DOA: 09/09/2020 PCP: Patient, [name] Admit date - 2020/09/09   Admitting Physician Clydie Braun, MD  Outpatient Primary MD for the patient is Patient, [name]  LOS - 5 Brief Narrative  Brian Wiley is a 87 year old male with medical history significant for CAD, HTN, BPH, prior CVA (924) who presented with acute onset shortness of breath with reported O2 saturations of 70% and was admitted with diagnosis of acute hypoxic respiratory failure and was found to have multifocal pneumonia on CTA.  Hospital course complicated by altered mental status concerning for encephalopathy presumed secondary to infectious etiology as well as concern for aspiration, as well as elevated troponin presumed to be demand ischemia in setting of infection.  Patient has required high amount of oxygen with peak of 15 L nonrebreather and required critical care consultation on admission who recommended BiPAP trial, but atient has not been able to tolerate BiPAP due to poor mentation.  Patient's family had multiple conversations around goals of care with PCCM, prior hospitalist as well as palliative care team on 1/2.  After conversation with family 1/2 myself explaining patient's poor prognosis despite supportive treatment, family elected to focus on comfort care measures and discontinue further medical interventions.  Subjective  Today patient is able to come down slowly from 15 L nonrebreather to 10 L on high flow.  Per nursing patient reports he is thirsty A & P   Acute hypoxic and hypercarbic respiratory failure secondary to multifocal pneumonia. Blood cultures unremarkable.  Peak multicompartment 15 L on nonrebreather, currently 10 L on high flow -- will complete day 5 Zosyn today,09/2020. -Comfort care measures started on 08/25/2020, IV morphine as needed air hunger/pain, as discussed with family  Sepsis secondary to multifocal  right-sided pneumonia.  Suspect likely risk factor cognitive decline and aspiration event.  Blood cultures unremarkable.  Strep/Legionella pending.  T-max 100.7 on 1/1, tachypneic on arrival with hypoxia and no leukocytosis or leukopenia and no lactic acidosis, resolved.  Acute encephalopathy, suspect multifactorial including metabolic from hypercarbia as well as infectious from pneumonia.  No focal deficits.  PCO2 improved from 88-66 but now back to 88.  Speech still a bit garbled.  Mental status baseline reported by family and oriented occasional bouts of sundowning at his facility with obvious cognitive decline over the last several months per daughter -Unable to do BiPAP due to waxing waning mentation -No further work-up given comfort care measures  Elevated troponin suspect demand ischemia in the setting of pneumonia.  No current chest pain.  EKG nonischemic -Discontinue telemetry given comfort care measures  Mild peripancreatic stranding with underlying pseudocyst.  Noted on CT abdomen on admission.  Lipase within normal limits no abdominal pain, likely represents recent pancreatitis -No abdominal pain  Goals of care.  Discussed with family ([name] and patient's wife on 08/25/2020).  Given poor prognosis, minimal improvement with supportive care.  Family would like to focus more on comfort care measures and avoid further medical interventions that do not aid in quality of life -Appreciate palliative care assistance, patient is a confirmed DNR/DNI CODE STATUS -Comfort care measures [08/25/2020, IV morphine as needed air hunger/dyspnea  Pharyngeal cervical esophageal dysphagia.  Evaluated with barium swallow by speech therapy. -As long as alert recommending dysphagia 3 (mechanical soft) solids     Family Communication  : Raechel Ache, by phone on 08/25/2020  Code Status : DNR/DNI  Disposition Plan  :  Patient is from SNF. Anticipated d/c date:  Anticipate in-hospital death.  Barriers to d/c or necessity for inpatient status:  Currently requiring high flow nasal cannula Consults  : Palliative  Procedures  : None  DVT Prophylaxis  : Discontinued given comfort care measures  MDM: The below labs and imaging reports were reviewed and summarized above.  Medication management as above.  Lab Results  Component Value Date   PLT 134 (L) 08/25/2020    Diet :  Diet Order            DIET DYS 3 Room service appropriate? No; Fluid consistency: Thin  Diet effective now                  Inpatient Medications Scheduled Meds:  chlorhexidine  15 mL Mouth Rinse BID   enoxaparin (LOVENOX) injection  40 mg Subcutaneous Q24H   mouth rinse  15 mL Mouth Rinse q12n4p   Continuous Infusions:  sodium chloride 75 mL/hr at 08/25/20 1045   piperacillin-tazobactam (ZOSYN)  IV 3.375 g (08/25/20 0546)   PRN Meds:.albuterol, haloperidol lactate  Antibiotics  :   Anti-infectives (From admission, onward)   Start     Dose/Rate Route Frequency Ordered Stop   08/21/20 1200  cefTRIAXone (ROCEPHIN) 2 g in sodium chloride 0.9 % 100 mL IVPB  Status:  Discontinued        2 g 200 mL/hr over 30 Minutes Intravenous Every 24 hours 08/11/2020 1443 08/05/2020 1840   08/21/20 1200  azithromycin (ZITHROMAX) 500 mg in sodium chloride 0.9 % 250 mL IVPB  Status:  Discontinued        500 mg 250 mL/hr over 60 Minutes Intravenous Every 24 hours 07/24/2020 1443 08/17/2020 1840   08/21/20 1200  azithromycin (ZITHROMAX) 500 mg in sodium chloride 0.9 % 250 mL IVPB        500 mg 250 mL/hr over 60 Minutes Intravenous Every 24 hours 07/24/2020 1916 08/24/20 1223   08/22/2020 1930  piperacillin-tazobactam (ZOSYN) IVPB 3.375 g        3.375 g 12.5 mL/hr over 240 Minutes Intravenous Every 8 hours 08/23/2020 1853 08/29/2020 2359   08/21/2020 1200  cefTRIAXone (ROCEPHIN) 1 g in sodium chloride 0.9 % 100 mL IVPB        1 g 200 mL/hr over 30 Minutes Intravenous  Once 07/28/2020 1154 08/12/2020 1311   08/19/2020 1200   azithromycin (ZITHROMAX) 500 mg in sodium chloride 0.9 % 250 mL IVPB        500 mg 250 mL/hr over 60 Minutes Intravenous  Once 08/05/2020 1154 08/16/2020 1328       Objective   Vitals:   08/25/20 0300 08/25/20 0739 08/25/20 0800 08/25/20 1200  BP: (!) 163/97 (!) 163/97 (!) 157/89 (!) 180/95  Pulse: 83 85 79 77  Resp: 20 (!) 24 20 18   Temp: 99.1 F (37.3 C)   98.1 F (36.7 C)  TempSrc: Oral  Oral Axillary  SpO2: 94% 98% 100% 93%  Weight:      Height:        SpO2: 93 % O2 Flow Rate (L/min): 10 L/min FiO2 (%): 35 %  Wt Readings from Last 3 Encounters:  07/28/2020 108 kg     Intake/Output Summary (Last 24 hours) at 08/25/2020 1307 Last data filed at 08/25/2020 0620 Gross per 24 hour  Intake 1000 ml  Output 701 ml  Net 299 ml    Physical Exam:   Somnolent, somewhat arousable, oriented to self No new F.N deficits,  [place].AT, Normal respiratory effort on 10 L high flow nasal cannula (SPO2 87-90%), increased rhonchi on right, left lungs clear RRR,No Gallops,Rubs or new Murmurs,  +ve B.Sounds, Abd Soft, No tenderness, No rebound, guarding or rigidity. No Cyanosis, No new Rash or bruise    I have personally reviewed the following:   Data Reviewed:  CBC Recent Labs  Lab 08-23-2020 1020 08/21/20 0150 08/25/20 0304  WBC 10.5 13.5* 5.8  HGB 12.5* 11.5* 11.0*  HCT 43.8 38.6* 38.5*  PLT 229 212 134*  MCV 96.7 96.3 101.3*  MCH 27.6 28.7 28.9  MCHC 28.5* 29.8* 28.6*  RDW 14.4 14.5 13.9  LYMPHSABS  --   --  0.7  MONOABS  --   --  0.2  EOSABS  --   --  0.0  BASOSABS  --   --  0.0    Chemistries  Recent Labs  Lab 2020/08/23 1020 08/21/20 0150 08/23/20 0021 08/25/20 0304  NA 141 143 144 145  K 4.4 4.4 4.0 4.5  CL 100 99 102 102  CO2 32 35* 35* 31  GLUCOSE 125* 115* 124* 119*  BUN 16 18 20 11   CREATININE 1.03 1.16 1.13 0.98  CALCIUM 8.6* 8.3* 8.3* 8.5*  AST 16  --   --   --   ALT 12  --   --   --   ALKPHOS 53  --   --   --   BILITOT 0.3  --   --   --     ------------------------------------------------------------------------------------------------------------------ No results for input(s): CHOL, HDL, LDLCALC, TRIG, CHOLHDL, LDLDIRECT in the last 72 hours.  No results found for: HGBA1C ------------------------------------------------------------------------------------------------------------------ No results for input(s): TSH, T4TOTAL, T3FREE, THYROIDAB in the last 72 hours.  Invalid input(s): FREET3 ------------------------------------------------------------------------------------------------------------------ No results for input(s): VITAMINB12, FOLATE, FERRITIN, TIBC, IRON, RETICCTPCT in the last 72 hours.  Coagulation profile No results for input(s): INR, PROTIME in the last 168 hours.  No results for input(s): DDIMER in the last 72 hours.  Cardiac Enzymes No results for input(s): CKMB, TROPONINI, MYOGLOBIN in the last 168 hours.  Invalid input(s): CK ------------------------------------------------------------------------------------------------------------------    Component Value Date/Time   BNP 153.4 (H) 08/23/2020 1020    Micro Results Recent Results (from the past 240 hour(s))  Resp Panel by RT-PCR (Flu A&B, Covid) Nasopharyngeal Swab     Status: None   Collection Time: 08/23/20 10:38 AM   Specimen: Nasopharyngeal Swab; Nasopharyngeal(NP) swabs in vial transport medium  Result Value Ref Range Status   SARS Coronavirus 2 by RT PCR NEGATIVE NEGATIVE Final    Comment: (NOTE) SARS-CoV-2 target nucleic acids are NOT DETECTED.  The SARS-CoV-2 RNA is generally detectable in upper respiratory specimens during the acute phase of infection. The lowest concentration of SARS-CoV-2 viral copies this assay can detect is 138 copies/mL. A negative result does not preclude SARS-Cov-2 infection and should not be used as the sole basis for treatment or other patient management decisions. A negative result may occur with   improper specimen collection/handling, submission of specimen other than nasopharyngeal swab, presence of viral mutation(s) within the areas targeted by this assay, and inadequate number of viral copies(<138 copies/mL). A negative result must be combined with clinical observations, patient history, and epidemiological information. The expected result is Negative.  Fact Sheet for Patients:  08/22/20  Fact Sheet for Healthcare Providers:  BloggerCourse.com  This test is no t yet approved or cleared by the [place] FDA and  has been authorized for detection and/or diagnosis of  SARS-CoV-2 by FDA under an Emergency Use Authorization (EUA). This EUA will remain  in effect (meaning this test can be used) for the duration of the COVID-19 declaration under Section 564(b)(1) of the Act, 21 U.S.C.section 360bbb-3(b)(1), unless the authorization is terminated  or revoked sooner.       Influenza A by PCR NEGATIVE NEGATIVE Final   Influenza B by PCR NEGATIVE NEGATIVE Final    Comment: (NOTE) The Xpert Xpress SARS-CoV-2/FLU/RSV plus assay is intended as an aid in the diagnosis of influenza from Nasopharyngeal swab specimens and should not be used as a sole basis for treatment. Nasal washings and aspirates are unacceptable for Xpert Xpress SARS-CoV-2/FLU/RSV testing.  Fact Sheet for Patients: BloggerCourse.comhttps://www.fda.gov/media/152166/download  Fact Sheet for Healthcare Providers: SeriousBroker.ithttps://www.fda.gov/media/152162/download  This test is not yet approved or cleared by the [place] FDA and has been authorized for detection and/or diagnosis of SARS-CoV-2 by FDA under an Emergency Use Authorization (EUA). This EUA will remain in effect (meaning this test can be used) for the duration of the COVID-19 declaration under Section 564(b)(1) of the Act, 21 U.S.C. section 360bbb-3(b)(1), unless the authorization is terminated  or revoked.  Performed at [facility], [address] [place], [place] [address]   Blood culture (routine x 2)     Status: None   Collection Time: 07/26/2020 12:22 PM   Specimen: BLOOD  Result Value Ref Range Status   Specimen Description BLOOD LEFT ANTECUBITAL  Final   Special Requests   Final    BOTTLES DRAWN AEROBIC AND ANAEROBIC Blood Culture adequate volume   Culture   Final    NO GROWTH 5 DAYS Performed at [facility], [address] [place], [place] [address]    Report Status 08/25/2020 FINAL  Final  Blood culture (routine x 2)     Status: None   Collection Time: 08/09/2020 12:30 PM   Specimen: BLOOD  Result Value Ref Range Status   Specimen Description BLOOD RIGHT ANTECUBITAL  Final   Special Requests   Final    BOTTLES DRAWN AEROBIC AND ANAEROBIC Blood Culture adequate volume   Culture   Final    NO GROWTH 5 DAYS Performed at [facility], [address] [place], [place] [address]    Report Status 08/25/2020 FINAL  Final    Radiology Reports CT Angio Chest PE W/Cm &/Or Wo Cm  Result Date: 07/27/2020 CLINICAL DATA:  Shortness of breath, abnormal chest radiograph, evaluate for PE EXAM: CT ANGIOGRAPHY CHEST WITH CONTRAST TECHNIQUE: Multidetector CT imaging of the chest was performed using the standard protocol during bolus administration of intravenous contrast. Multiplanar CT image reconstructions and MIPs were obtained to evaluate the vascular anatomy. CONTRAST:  75mL OMNIPAQUE IOHEXOL 350 MG/ML SOLN COMPARISON:  Chest radiograph dated 08/12/2020 FINDINGS: Cardiovascular: Satisfactory opacification of the bilateral pulmonary arteries to the segmental level. No evidence of pulmonary embolism. Although not tailored for evaluation of the thoracic aorta, there is no evidence of thoracic aortic aneurysm or dissection. Mild atherosclerotic calcifications of the aortic arch. The heart is top-normal in size.  No pericardial effusion. Mild three-vessel coronary  atherosclerosis. Mediastinum/Nodes: No suspicious mediastinal lymphadenopathy. Visualized thyroid is unremarkable. Lungs/Pleura: Compressive atelectasis in the bilateral lower lobes. Additional multifocal patchy opacities in the right upper and middle lobes, compatible with pneumonia. No suspicious pulmonary nodules. Mild eventration of the right hemidiaphragm. No pleural effusion or pneumothorax. Upper Abdomen: Visualized upper abdomen is notable for mild stranding along the pancreatic tail with a suspected  underlying 2.2 cm lesion (series 5/image 127). This likely reflects a pseudocyst in the setting of recent pancreatitis, but is technically incompletely evaluated. Musculoskeletal: Mild degenerative changes of the visualized thoracolumbar spine. Review of the MIP images confirms the above findings. IMPRESSION: No evidence of pulmonary embolism. Multifocal right lung pneumonia. Additional bilateral lower lobe atelectasis. Mild peripancreatic stranding with underlying 2.2 cm pancreatic tail lesion, favoring recent pancreatitis with underlying pseudocyst. Correlate with laboratory evaluation and consider CT abdomen with/without contrast for further evaluation as clinically warranted. Aortic Atherosclerosis (ICD10-I70.0). Electronically Signed   By: Charline BillsSriyesh  Krishnan M.D.   On: 08/22/2020 13:43   DG Chest Port 1 View  Result Date: 08/05/2020 CLINICAL DATA:  Acute onset shortness of breath and oxygen desaturation today. EXAM: PORTABLE CHEST 1 VIEW COMPARISON:  Single-view of the chest 06/18/2020 06/13/2020. FINDINGS: Elevation of the right hemidiaphragm is again seen. There is new right basilar airspace disease. The left lung is clear. Heart size is upper normal. No pneumothorax or pleural fluid. IMPRESSION: New right basilar airspace disease could be due to aspiration, pneumonia or atelectasis. Electronically Signed   By: Drusilla Kannerhomas  Dalessio M.D.   On: 08/06/2020 10:53   DG Swallowing Func-Speech  Pathology  Result Date: 08/22/2020 Objective Swallowing Evaluation: Type of Study: MBS-Modified Barium Swallow Study  Patient Details Name: Dallie DadWilliam Neas MRN: [id number] Date of Birth: 02-25-1933 Today's Date: 08/22/2020 Time: SLP Start Time (ACUTE ONLY): 0830 -SLP Stop Time (ACUTE ONLY): 0848 SLP Time Calculation (min) (ACUTE ONLY): 18 min Past Medical History: Past Medical History: Diagnosis Date  BPH (benign prostatic hyperplasia)   CVA (cerebral vascular accident) (HCC) 1994  HTN (hypertension)  Past Surgical History: No past surgical history on file. HPI: Pt is an 87 yo male presenting from SNF with AMS and hypoxia that started after breakfast with concern for aspiration. CTA chest showed multifocal R sided PNA. PMH includes: CVA (1994), HTN, suspected underlying dementia  Subjective: pt is eager for POs Assessment / Plan / Recommendation CHL IP CLINICAL IMPRESSIONS 08/22/2020 Clinical Impression Pt demonstrates a mild pharyngeal and cervical esophageal dysphagia, accounting for clinical signs of dysphagia at bedside including wet vocal quality, belching and occasional throat clearing. Despite this pt did not aspirate during the study though he did cough often. Pts swallow characterized by premature spillage of bolus to pharynx with accumulation in the vallecuale and pyriforms and leading to flash penetration before the swallow (PAS 2). There is mild residue in primarily in the UES due to prominent cricopharyngeal muscle with mild backflow to pyriforms, though there is also some mild residue around the epiglottis and lateral channels. Pt clears with throat clearing and second swallows. Pills and solids passed through UES and esophageal sweep appeared WNL though no radiologist present to confirm. More significant dysphagia is possible with a large meal or suboptimal positioning. Will f/u for further bedside observation and discussion of basic precautions. SLP Visit Diagnosis Dysphagia, unspecified (R13.10)  Attention and concentration deficit following -- Frontal lobe and executive function deficit following -- Impact on safety and function Mild aspiration risk   CHL IP TREATMENT RECOMMENDATION 08/22/2020 Treatment Recommendations Therapy as outlined in treatment plan below   Prognosis 08/22/2020 Prognosis for Safe Diet Advancement Good Barriers to Reach Goals Cognitive deficits Barriers/Prognosis Comment -- CHL IP DIET RECOMMENDATION 08/22/2020 SLP Diet Recommendations Dysphagia 3 (Mech soft) solids;Thin liquid Liquid Administration via Cup;Straw Medication Administration Whole meds with liquid Compensations Slow rate;Small sips/bites;Follow solids with liquid;Multiple dry swallows after each bite/sip Postural Changes Seated upright at 90  degrees   CHL IP OTHER RECOMMENDATIONS 08/22/2020 Recommended Consults -- Oral Care Recommendations Oral care BID Other Recommendations Have oral suction available   CHL IP FOLLOW UP RECOMMENDATIONS 08/22/2020 Follow up Recommendations Skilled Nursing facility;24 hour supervision/assistance   CHL IP FREQUENCY AND DURATION 08/22/2020 Speech Therapy Frequency (ACUTE ONLY) min 2x/week Treatment Duration 2 weeks      CHL IP ORAL PHASE 08/22/2020 Oral Phase Impaired Oral - Pudding Teaspoon -- Oral - Pudding Cup -- Oral - Honey Teaspoon -- Oral - Honey Cup -- Oral - Nectar Teaspoon -- Oral - Nectar Cup -- Oral - Nectar Straw -- Oral - Thin Teaspoon NT Oral - Thin Cup Premature spillage Oral - Thin Straw Premature spillage Oral - Puree Premature spillage Oral - Mech Soft Premature spillage Oral - Regular NT Oral - Multi-Consistency NT Oral - Pill Premature spillage Oral Phase - Comment --  CHL IP PHARYNGEAL PHASE 08/22/2020 Pharyngeal Phase Impaired Pharyngeal- Pudding Teaspoon -- Pharyngeal -- Pharyngeal- Pudding Cup -- Pharyngeal -- Pharyngeal- Honey Teaspoon -- Pharyngeal -- Pharyngeal- Honey Cup -- Pharyngeal -- Pharyngeal- Nectar Teaspoon -- Pharyngeal -- Pharyngeal- Nectar Cup --  Pharyngeal -- Pharyngeal- Nectar Straw -- Pharyngeal -- Pharyngeal- Thin Teaspoon NT Pharyngeal -- Pharyngeal- Thin Cup Pharyngeal residue - cp segment;Pharyngeal residue - pyriform;Penetration/Aspiration during swallow Pharyngeal Material enters airway, remains ABOVE vocal cords then ejected out Pharyngeal- Thin Straw Penetration/Aspiration during swallow;Pharyngeal residue - cp segment;Pharyngeal residue - pyriform Pharyngeal Material enters airway, remains ABOVE vocal cords then ejected out Pharyngeal- Puree Pharyngeal residue - pyriform;Pharyngeal residue - cp segment Pharyngeal Material does not enter airway Pharyngeal- Mechanical Soft Pharyngeal residue - cp segment;Pharyngeal residue - pyriform Pharyngeal Material does not enter airway Pharyngeal- Regular NT Pharyngeal -- Pharyngeal- Multi-consistency NT Pharyngeal -- Pharyngeal- Pill WFL Pharyngeal -- Pharyngeal Comment --  CHL IP CERVICAL ESOPHAGEAL PHASE 08/22/2020 Cervical Esophageal Phase Impaired Pudding Teaspoon -- Pudding Cup -- Honey Teaspoon -- Honey Cup -- Nectar Teaspoon -- Nectar Cup -- Nectar Straw -- Thin Teaspoon Prominent cricopharyngeal segment;Reduced cricopharyngeal relaxation Thin Cup Reduced cricopharyngeal relaxation;Prominent cricopharyngeal segment Thin Straw Reduced cricopharyngeal relaxation;Prominent cricopharyngeal segment Puree Reduced cricopharyngeal relaxation;Prominent cricopharyngeal segment Mechanical Soft Reduced cricopharyngeal relaxation;Prominent cricopharyngeal segment Regular NT Multi-consistency NT Pill WFL Cervical Esophageal Comment -- Harlon Ditty, MA CCC-SLP Acute Rehabilitation Services Pager [phone] Office [phone] Claudine Mouton 08/22/2020, 9:10 AM              ECHOCARDIOGRAM COMPLETE  Result Date: 08/21/2020    ECHOCARDIOGRAM REPORT   Patient Name:   Brian Wiley Date of Exam: 08/21/2020 Medical Rec #:  [id number]     Height:       69.0 in Accession #:    [id number]    Weight:        238.1 lb Date of Birth:  06-01-1933    BSA:          2.225 m Patient Age:    87 years      BP:           123/77 mmHg Patient Gender: M             HR:           72 bpm. Exam Location:  Inpatient Procedure: 2D Echo, 3D Echo, Color Doppler and Cardiac Doppler Indications:    Elevated Troponin  History:        Patient has no prior history of Echocardiogram examinations.  Stroke; Risk Factors:Hypertension.  Sonographer:    Leta Jungling RDCS Referring Phys: [id number] RONDELL A SMITH  Sonographer Comments: Image acquisition challenging due to respiratory motion, BiPap. Global longitudinal strain was attempted. IMPRESSIONS  1. Left ventricular ejection fraction, by estimation, is 55 to 60%. The left ventricle has normal function. The left ventricle has no regional wall motion abnormalities. Left ventricular diastolic parameters are indeterminate.  2. Right ventricular systolic function is mildly reduced. The right ventricular size is normal. There is moderately elevated pulmonary artery systolic pressure. The estimated right ventricular systolic pressure is 53.7 mmHg.  3. The mitral valve is normal in structure. No evidence of mitral valve regurgitation. No evidence of mitral stenosis.  4. The aortic valve is tricuspid. Aortic valve regurgitation is mild. No aortic stenosis is present.  5. Aortic dilatation noted. There is mild dilatation of the ascending aorta, measuring 40 mm.  6. The inferior vena cava is dilated in size with <50% respiratory variability, suggesting right atrial pressure of 15 mmHg. FINDINGS  Left Ventricle: Left ventricular ejection fraction, by estimation, is 55 to 60%. The left ventricle has normal function. The left ventricle has no regional wall motion abnormalities. The left ventricular internal cavity size was normal in size. There is  no left ventricular hypertrophy. Left ventricular diastolic parameters are indeterminate. Right Ventricle: The right ventricular size is normal. No  increase in right ventricular wall thickness. Right ventricular systolic function is mildly reduced. There is moderately elevated pulmonary artery systolic pressure. The tricuspid regurgitant velocity is 3.11 m/s, and with an assumed right atrial pressure of 15 mmHg, the estimated right ventricular systolic pressure is 53.7 mmHg. Left Atrium: Left atrial size was normal in size. Right Atrium: Right atrial size was normal in size. Pericardium: There is no evidence of pericardial effusion. Mitral Valve: The mitral valve is normal in structure. No evidence of mitral valve regurgitation. No evidence of mitral valve stenosis. Tricuspid Valve: The tricuspid valve is normal in structure. Tricuspid valve regurgitation is trivial. Aortic Valve: The aortic valve is tricuspid. Aortic valve regurgitation is mild. Aortic regurgitation PHT measures 380 msec. No aortic stenosis is present. Pulmonic Valve: The pulmonic valve was not well visualized. Pulmonic valve regurgitation is not visualized. Aorta: The aortic root is normal in size and structure and aortic dilatation noted. There is mild dilatation of the ascending aorta, measuring 40 mm. Venous: The inferior vena cava is dilated in size with less than 50% respiratory variability, suggesting right atrial pressure of 15 mmHg. IAS/Shunts: The interatrial septum was not well visualized.  LEFT VENTRICLE PLAX 2D LVIDd:         5.40 cm  Diastology LVIDs:         3.73 cm  LV e' medial:    4.90 cm/s LV PW:         1.00 cm  LV E/e' medial:  12.0 LV IVS:        1.00 cm  LV e' lateral:   4.03 cm/s LVOT diam:     1.90 cm  LV E/e' lateral: 14.6 LV SV:         48 LV SV Index:   21 LVOT Area:     2.84 cm  RIGHT VENTRICLE RV S prime:     6.74 cm/s TAPSE (M-mode): 1.4 cm LEFT ATRIUM             Index       RIGHT ATRIUM           Index LA  diam:        3.50 cm 1.57 cm/m  RA Area:     16.50 cm LA Vol (A2C):   49.0 ml 22.02 ml/m RA Volume:   40.30 ml  18.11 ml/m LA Vol (A4C):   43.0 ml 19.33  ml/m LA Biplane Vol: 45.9 ml 20.63 ml/m  AORTIC VALVE LVOT Vmax:   90.10 cm/s LVOT Vmean:  59.900 cm/s LVOT VTI:    0.168 m AI PHT:      380 msec  AORTA Ao Root diam: 3.40 cm MITRAL VALVE               TRICUSPID VALVE MV Area (PHT): 3.37 cm    TR Peak grad:   38.7 mmHg MV Decel Time: 225 msec    TR Vmax:        311.00 cm/s MV E velocity: 58.70 cm/s MV A velocity: 76.30 cm/s  SHUNTS MV E/A ratio:  0.77        Systemic VTI:  0.17 m                            Systemic Diam: 1.90 cm Epifanio Lesches MD Electronically signed by Epifanio Lesches MD Signature Date/Time: 08/21/2020/3:10:28 PM    Final    VAS [place] LOWER EXTREMITY VENOUS (DVT)  Result Date: 08/21/2020  Lower Venous DVT Study Other Indications: RLE edema per chart. Comparison Study: No prior studies Performing Technologist: Ernestene Mention  Examination Guidelines: A complete evaluation includes B-mode imaging, spectral Doppler, color Doppler, and power Doppler as needed of all accessible portions of each vessel. Bilateral testing is considered an integral part of a complete examination. Limited examinations for reoccurring indications may be performed as noted. The reflux portion of the exam is performed with the patient in reverse Trendelenburg.  +---------+---------------+---------+-----------+----------+--------------+  RIGHT     Compressibility Phasicity Spontaneity Properties Thrombus Aging  +---------+---------------+---------+-----------+----------+--------------+  CFV       Full            Yes       Yes                                    +---------+---------------+---------+-----------+----------+--------------+  SFJ       Full                                                             +---------+---------------+---------+-----------+----------+--------------+  FV Prox   Full            Yes       Yes                                    +---------+---------------+---------+-----------+----------+--------------+  FV Mid    Full            Yes        Yes                                    +---------+---------------+---------+-----------+----------+--------------+  FV Distal Full  Yes       Yes                                    +---------+---------------+---------+-----------+----------+--------------+  PFV       Full                                                             +---------+---------------+---------+-----------+----------+--------------+  POP       Full            Yes       Yes                                    +---------+---------------+---------+-----------+----------+--------------+  PTV       Full                                                             +---------+---------------+---------+-----------+----------+--------------+  PERO      Full                                                             +---------+---------------+---------+-----------+----------+--------------+   +---------+---------------+---------+-----------+----------+--------------+  LEFT      Compressibility Phasicity Spontaneity Properties Thrombus Aging  +---------+---------------+---------+-----------+----------+--------------+  CFV       Full            Yes       Yes                                    +---------+---------------+---------+-----------+----------+--------------+  SFJ       Full                                                             +---------+---------------+---------+-----------+----------+--------------+  FV Prox   Full            Yes       Yes                                    +---------+---------------+---------+-----------+----------+--------------+  FV Mid    Full            Yes       Yes                                    +---------+---------------+---------+-----------+----------+--------------+  FV Distal Full  Yes       Yes                                    +---------+---------------+---------+-----------+----------+--------------+  PFV       Full                                                              +---------+---------------+---------+-----------+----------+--------------+  POP       Full            Yes       Yes                                    +---------+---------------+---------+-----------+----------+--------------+  PTV       Full                                                             +---------+---------------+---------+-----------+----------+--------------+  PERO      Full                                                             +---------+---------------+---------+-----------+----------+--------------+     *See table(s) above for measurements and observations. Electronically signed by Heath Lark on 08/21/2020 at 5:31:04 PM.    Final      Time Spent in minutes  30     Laverna Peace M.D on 08/25/2020 at 1:07 PM  To page go to [email or web address] - password [facility]

## 2020-08-26 DIAGNOSIS — Z515 Encounter for palliative care: Secondary | ICD-10-CM | POA: Diagnosis not present

## 2020-08-26 DIAGNOSIS — J9601 Acute respiratory failure with hypoxia: Secondary | ICD-10-CM | POA: Diagnosis not present

## 2020-08-26 MED ORDER — MORPHINE 100MG IN NS 100ML (1MG/ML) PREMIX INFUSION
1.0000 mg/h | INTRAVENOUS | Status: DC
Start: 2020-08-26 — End: 2020-08-27
  Administered 2020-08-26: 1 mg/h via INTRAVENOUS
  Filled 2020-08-26: qty 100

## 2020-08-26 MED ORDER — MORPHINE BOLUS VIA INFUSION
2.0000 mg | INTRAVENOUS | Status: DC | PRN
  Administered 2020-08-26 (×3): 2 mg via INTRAVENOUS
  Filled 2020-08-26: qty 2

## 2020-08-26 MED ORDER — GLYCOPYRROLATE 0.2 MG/ML IJ SOLN
0.1000 mg | Freq: Four times a day (QID) | INTRAMUSCULAR | Status: DC
  Administered 2020-08-26 (×2): 0.1 mg via INTRAVENOUS
  Filled 2020-08-26 (×3): qty 1

## 2020-08-27 NOTE — Progress Notes (Signed)
Wasted 60 ml's of patient's IV morphine with Tawni Millers, RN.

## 2020-09-04 NOTE — Discharge Summary (Signed)
Physician Death Summary  Brian Wiley OVF:[id number] DOB: Dec 23, 1932 DOA: 2020-08-23  PCP: Patient, [name]  Admit date: 08/23/20 Time and Date of Death: 10:22 PM 08-29-2020  Discharge Diagnoses: Principal diagnosis is #1 1. Acute respiratory failure with hypoxia 2. Sepsis due to pneumonia 3. Aspiration pneumonia 4. Protein calorie malnutrition 5. Encephalopathy 6. Elevated troponin  Discharge Condition: [place]  History of present illness: Brian Wiley is a 87 y.o. male with medical history significant of hypertension, CVA in 1994, encephalopathy with suspected underlying dementia, and BPH presents after having acute onset of respiratory distress sometime before 9 AM this morning.  History is obtained mostly from the patient daughter and review of records.  O2 saturations were reported to have acutely dropped into the 70s.  It is unclear the patient had been eating breakfast this morning prior to onset of symptoms.  On a nonrebreather fractious O2 saturations were around 80% and EMS placed him on CPAP with O2 saturation is noted to be around 89%.    The patient had recently moved here from [place] approximately 2 months ago and after getting here had an acute decline. His daughter states that he had a previous stroke, but was previously able to get around on his own.  He had been admitted into the hospital in 05/2020 with acute encephalopathy thought to be secondary to dementia with suspected UTI.  MRI of his brain at that time did not show any acute strokes.  Ultimately [facility].  Patient had been started on risperidone for to aggression and hostility issues.  Since patient had been at the nursing facility they had done a Mini-Mental state exam which gave concern for dementia, but the patient has not formally been seen by neurology.  She reports that gone to see him last week and states that it was difficult  to understand what he was saying.  He also has had a mild cough.   ED Course: Upon admission to the emergency department patient was seen to be afebrile, pulse 76-106, respirations 21-28, blood pressures elevated 173/88, and O2 saturations noted to be maintained above 90% on facemask.  Labs are significant for WBC 10.5, hemoglobin 12.5, albumin 2.7, BNP 153.4, high-sensitivity troponin 104, and D-dimer 3.13.  Initial chest x-ray noted a new right basilar opacity concerning for aspiration or pneumonia.  Due to the elevated D-dimer a CT angiogram was obtained which showed multifocal right lung pneumonia and mild perinephric stranding and 2.2 cm pancreatic tail lesion.  COVID-19 and influenza screen were negative.  Blood cultures were obtained and patient was started on empiric antibiotics of Rocephin and azithromycin.  Hospital Course: Brian Wiley a 87 y.o.malewho was   admitted with AMS and hypoxia likely felt to be 2/2 aspiration PNA. PCCM called evening 08/24/2023 for worsening mental status 2/2 hypercapnia. This morning the patient's oxygen requirements have increased to 15L by Venti mask.   Palliative care was consulted due to the patient's worsening status and very poor prognosis. He was unable to keep mask on his face to provide adequate oxygenation. Palliative care met with the family on 08/25/2020. At that point the family stated that they wished to continue the acute care course of treatment. However, the patient continued to decline throughout the day. Dr. Lonny Prude spoke with the family again on the afternoon of 2020-08-29. They have decided to make the patient full comfort care. Palliative care has met with the family again today. They  are seeking placement in residential hospice at [facility].   Today's assessment: For last physical exam prior to the patient's demise, please find physical exam under progress note dated 09/14/2020. O: Vitals:  Vitals:   09/14/20 0353 2020-09-14 0732  BP: (!)  169/91 (!) 155/95  Pulse: 82 86  Resp: 20 20  Temp: 98.2 F (36.8 C) 97.6 F (36.4 C)  SpO2: 99% 97%    Allergies as of 08/27/2020   No Known Allergies     Medication List    ASK your doctor about these medications   acetaminophen 325 MG tablet Commonly known as: TYLENOL Take 2 tablets (650 mg total) by mouth every 6 (six) hours as needed for mild pain (or Fever >/= 101).   aspirin EC 81 MG tablet Take 81 mg by mouth daily. Swallow whole.   atorvastatin 10 MG tablet Commonly known as: LIPITOR Take 10 mg by mouth daily.   carvedilol 6.25 MG tablet Commonly known as: COREG Take 6.25 mg by mouth 2 (two) times daily with a meal.   cefTRIAXone 1 g injection Commonly known as: ROCEPHIN Inject 1 g into the muscle at bedtime.   finasteride 5 MG tablet Commonly known as: PROSCAR Take 5 mg by mouth at bedtime.   furosemide 20 MG tablet Commonly known as: LASIX Take 20 mg by mouth.   polyethylene glycol 17 g packet Commonly known as: MIRALAX / GLYCOLAX Take 17 g by mouth daily as needed for mild constipation.   risperiDONE 0.5 MG tablet Commonly known as: RISPERDAL Take 0.5 mg by mouth every evening.   risperiDONE 0.25 MG tablet Commonly known as: RISPERDAL Take 0.25 mg by mouth in the morning.   tamsulosin 0.4 MG Caps capsule Commonly known as: FLOMAX Take 0.4 mg by mouth at bedtime.      No Known Allergies  The results of significant diagnostics from this hospitalization (including imaging, microbiology, ancillary and laboratory) are listed below for reference.    Significant Diagnostic Studies: CT Angio Chest PE W/Cm &/Or Wo Cm  Result Date: 08/16/2020 CLINICAL DATA:  Shortness of breath, abnormal chest radiograph, evaluate for PE EXAM: CT ANGIOGRAPHY CHEST WITH CONTRAST TECHNIQUE: Multidetector CT imaging of the chest was performed using the standard protocol during bolus administration of intravenous contrast. Multiplanar CT image reconstructions and MIPs  were obtained to evaluate the vascular anatomy. CONTRAST:  45m OMNIPAQUE IOHEXOL 350 MG/ML SOLN COMPARISON:  Chest radiograph dated 08/12/2020 FINDINGS: Cardiovascular: Satisfactory opacification of the bilateral pulmonary arteries to the segmental level. No evidence of pulmonary embolism. Although not tailored for evaluation of the thoracic aorta, there is no evidence of thoracic aortic aneurysm or dissection. Mild atherosclerotic calcifications of the aortic arch. The heart is top-normal in size.  No pericardial effusion. Mild three-vessel coronary atherosclerosis. Mediastinum/Nodes: No suspicious mediastinal lymphadenopathy. Visualized thyroid is unremarkable. Lungs/Pleura: Compressive atelectasis in the bilateral lower lobes. Additional multifocal patchy opacities in the right upper and middle lobes, compatible with pneumonia. No suspicious pulmonary nodules. Mild eventration of the right hemidiaphragm. No pleural effusion or pneumothorax. Upper Abdomen: Visualized upper abdomen is notable for mild stranding along the pancreatic tail with a suspected underlying 2.2 cm lesion (series 5/image 127). This likely reflects a pseudocyst in the setting of recent pancreatitis, but is technically incompletely evaluated. Musculoskeletal: Mild degenerative changes of the visualized thoracolumbar spine. Review of the MIP images confirms the above findings. IMPRESSION: No evidence of pulmonary embolism. Multifocal right lung pneumonia. Additional bilateral lower lobe atelectasis. Mild peripancreatic stranding with  underlying 2.2 cm pancreatic tail lesion, favoring recent pancreatitis with underlying pseudocyst. Correlate with laboratory evaluation and consider CT abdomen with/without contrast for further evaluation as clinically warranted. Aortic Atherosclerosis (ICD10-I70.0). Electronically Signed   By: Julian Hy M.D.   On: 07/25/2020 13:43   DG Chest Port 1 View  Result Date: 08/19/2020 CLINICAL DATA:  Acute  onset shortness of breath and oxygen desaturation today. EXAM: PORTABLE CHEST 1 VIEW COMPARISON:  Single-view of the chest 06/18/2020 06/13/2020. FINDINGS: Elevation of the right hemidiaphragm is again seen. There is new right basilar airspace disease. The left lung is clear. Heart size is upper normal. No pneumothorax or pleural fluid. IMPRESSION: New right basilar airspace disease could be due to aspiration, pneumonia or atelectasis. Electronically Signed   By: Inge Rise M.D.   On: 07/30/2020 10:53   DG Swallowing Func-Speech Pathology  Result Date: 08/22/2020 Objective Swallowing Evaluation: Type of Study: MBS-Modified Barium Swallow Study  Patient Details Name: Brian Wiley MRN: [id number] Date of Birth: 24-Feb-1933 Today's Date: 08/22/2020 Time: SLP Start Time (ACUTE ONLY): 0830 -SLP Stop Time (ACUTE ONLY): 0848 SLP Time Calculation (min) (ACUTE ONLY): 18 min Past Medical History: Past Medical History: Diagnosis Date . BPH (benign prostatic hyperplasia)  . CVA (cerebral vascular accident) ([place]) 1994 . HTN (hypertension)  Past Surgical History: No past surgical history on file. HPI: Pt is an 87 yo male presenting from SNF with AMS and hypoxia that started after breakfast with concern for aspiration. CTA chest showed multifocal R sided PNA. PMH includes: CVA (1994), HTN, suspected underlying dementia  Subjective: pt is eager for POs Assessment / Plan / Recommendation CHL IP CLINICAL IMPRESSIONS 08/22/2020 Clinical Impression Pt demonstrates a mild pharyngeal and cervical esophageal dysphagia, accounting for clinical signs of dysphagia at bedside including wet vocal quality, belching and occasional throat clearing. Despite this pt did not aspirate during the study though he did cough often. Pts swallow characterized by premature spillage of bolus to pharynx with accumulation in the vallecuale and pyriforms and leading to flash penetration before the swallow (PAS 2). There is mild residue in primarily in  the UES due to prominent cricopharyngeal muscle with mild backflow to pyriforms, though there is also some mild residue around the epiglottis and lateral channels. Pt clears with throat clearing and second swallows. Pills and solids passed through UES and esophageal sweep appeared WNL though no radiologist present to confirm. More significant dysphagia is possible with a large meal or suboptimal positioning. Will f/u for further bedside observation and discussion of basic precautions. SLP Visit Diagnosis Dysphagia, unspecified (R13.10) Attention and concentration deficit following -- Frontal lobe and executive function deficit following -- Impact on safety and function Mild aspiration risk   CHL IP TREATMENT RECOMMENDATION 08/22/2020 Treatment Recommendations Therapy as outlined in treatment plan below   Prognosis 08/22/2020 Prognosis for Safe Diet Advancement Good Barriers to Reach Goals Cognitive deficits Barriers/Prognosis Comment -- CHL IP DIET RECOMMENDATION 08/22/2020 SLP Diet Recommendations Dysphagia 3 (Mech soft) solids;Thin liquid Liquid Administration via Cup;Straw Medication Administration Whole meds with liquid Compensations Slow rate;Small sips/bites;Follow solids with liquid;Multiple dry swallows after each bite/sip Postural Changes Seated upright at 90 degrees   CHL IP OTHER RECOMMENDATIONS 08/22/2020 Recommended Consults -- Oral Care Recommendations Oral care BID Other Recommendations Have oral suction available   CHL IP FOLLOW UP RECOMMENDATIONS 08/22/2020 Follow up Recommendations Skilled Nursing facility;24 hour supervision/assistance   CHL IP FREQUENCY AND DURATION 08/22/2020 Speech Therapy Frequency (ACUTE ONLY) min 2x/week Treatment Duration 2 weeks  CHL IP ORAL PHASE 08/22/2020 Oral Phase Impaired Oral - Pudding Teaspoon -- Oral - Pudding Cup -- Oral - Honey Teaspoon -- Oral - Honey Cup -- Oral - Nectar Teaspoon -- Oral - Nectar Cup -- Oral - Nectar Straw -- Oral - Thin Teaspoon NT Oral -  Thin Cup Premature spillage Oral - Thin Straw Premature spillage Oral - Puree Premature spillage Oral - Mech Soft Premature spillage Oral - Regular NT Oral - Multi-Consistency NT Oral - Pill Premature spillage Oral Phase - Comment --  CHL IP PHARYNGEAL PHASE 08/22/2020 Pharyngeal Phase Impaired Pharyngeal- Pudding Teaspoon -- Pharyngeal -- Pharyngeal- Pudding Cup -- Pharyngeal -- Pharyngeal- Honey Teaspoon -- Pharyngeal -- Pharyngeal- Honey Cup -- Pharyngeal -- Pharyngeal- Nectar Teaspoon -- Pharyngeal -- Pharyngeal- Nectar Cup -- Pharyngeal -- Pharyngeal- Nectar Straw -- Pharyngeal -- Pharyngeal- Thin Teaspoon NT Pharyngeal -- Pharyngeal- Thin Cup Pharyngeal residue - cp segment;Pharyngeal residue - pyriform;Penetration/Aspiration during swallow Pharyngeal Material enters airway, remains ABOVE vocal cords then ejected out Pharyngeal- Thin Straw Penetration/Aspiration during swallow;Pharyngeal residue - cp segment;Pharyngeal residue - pyriform Pharyngeal Material enters airway, remains ABOVE vocal cords then ejected out Pharyngeal- Puree Pharyngeal residue - pyriform;Pharyngeal residue - cp segment Pharyngeal Material does not enter airway Pharyngeal- Mechanical Soft Pharyngeal residue - cp segment;Pharyngeal residue - pyriform Pharyngeal Material does not enter airway Pharyngeal- Regular NT Pharyngeal -- Pharyngeal- Multi-consistency NT Pharyngeal -- Pharyngeal- Pill WFL Pharyngeal -- Pharyngeal Comment --  CHL IP CERVICAL ESOPHAGEAL PHASE 08/22/2020 Cervical Esophageal Phase Impaired Pudding Teaspoon -- Pudding Cup -- Honey Teaspoon -- Honey Cup -- Nectar Teaspoon -- Nectar Cup -- Nectar Straw -- Thin Teaspoon Prominent cricopharyngeal segment;Reduced cricopharyngeal relaxation Thin Cup Reduced cricopharyngeal relaxation;Prominent cricopharyngeal segment Thin Straw Reduced cricopharyngeal relaxation;Prominent cricopharyngeal segment Puree Reduced cricopharyngeal relaxation;Prominent cricopharyngeal segment  Mechanical Soft Reduced cricopharyngeal relaxation;Prominent cricopharyngeal segment Regular NT Multi-consistency NT Pill WFL Cervical Esophageal Comment -- Herbie Baltimore, MA CCC-SLP Acute Rehabilitation Services Pager [phone] Office [phone] Lynann Beaver 08/22/2020, 9:10 AM              ECHOCARDIOGRAM COMPLETE  Result Date: 08/21/2020    ECHOCARDIOGRAM REPORT   Patient Name:   Brian Wiley Date of Exam: 08/21/2020 Medical Rec #:  [id number]     Height:       69.0 in Accession #:    [id number]    Weight:       238.1 lb Date of Birth:  1933-02-17    BSA:          2.225 m Patient Age:    8 years      BP:           123/77 mmHg Patient Gender: M             HR:           72 bpm. Exam Location:  Inpatient Procedure: 2D Echo, 3D Echo, Color Doppler and Cardiac Doppler Indications:    Elevated Troponin  History:        Patient has no prior history of Echocardiogram examinations.                 Stroke; Risk Factors:Hypertension.  Sonographer:    Darlina Sicilian RDCS Referring Phys: [id number] RONDELL A SMITH  Sonographer Comments: Image acquisition challenging due to respiratory motion, BiPap. Global longitudinal strain was attempted. IMPRESSIONS  1. Left ventricular ejection fraction, by estimation, is 55 to 60%. The left ventricle has normal function. The left ventricle has no regional wall motion abnormalities. Left  ventricular diastolic parameters are indeterminate.  2. Right ventricular systolic function is mildly reduced. The right ventricular size is normal. There is moderately elevated pulmonary artery systolic pressure. The estimated right ventricular systolic pressure is 95.3 mmHg.  3. The mitral valve is normal in structure. No evidence of mitral valve regurgitation. No evidence of mitral stenosis.  4. The aortic valve is tricuspid. Aortic valve regurgitation is mild. No aortic stenosis is present.  5. Aortic dilatation noted. There is mild dilatation of the ascending aorta, measuring 40  mm.  6. The inferior vena cava is dilated in size with <50% respiratory variability, suggesting right atrial pressure of 15 mmHg. FINDINGS  Left Ventricle: Left ventricular ejection fraction, by estimation, is 55 to 60%. The left ventricle has normal function. The left ventricle has no regional wall motion abnormalities. The left ventricular internal cavity size was normal in size. There is  no left ventricular hypertrophy. Left ventricular diastolic parameters are indeterminate. Right Ventricle: The right ventricular size is normal. No increase in right ventricular wall thickness. Right ventricular systolic function is mildly reduced. There is moderately elevated pulmonary artery systolic pressure. The tricuspid regurgitant velocity is 3.11 m/s, and with an assumed right atrial pressure of 15 mmHg, the estimated right ventricular systolic pressure is 20.2 mmHg. Left Atrium: Left atrial size was normal in size. Right Atrium: Right atrial size was normal in size. Pericardium: There is no evidence of pericardial effusion. Mitral Valve: The mitral valve is normal in structure. No evidence of mitral valve regurgitation. No evidence of mitral valve stenosis. Tricuspid Valve: The tricuspid valve is normal in structure. Tricuspid valve regurgitation is trivial. Aortic Valve: The aortic valve is tricuspid. Aortic valve regurgitation is mild. Aortic regurgitation PHT measures 380 msec. No aortic stenosis is present. Pulmonic Valve: The pulmonic valve was not well visualized. Pulmonic valve regurgitation is not visualized. Aorta: The aortic root is normal in size and structure and aortic dilatation noted. There is mild dilatation of the ascending aorta, measuring 40 mm. Venous: The inferior vena cava is dilated in size with less than 50% respiratory variability, suggesting right atrial pressure of 15 mmHg. IAS/Shunts: The interatrial septum was not well visualized.  LEFT VENTRICLE PLAX 2D LVIDd:         5.40 cm  Diastology  LVIDs:         3.73 cm  LV e' medial:    4.90 cm/s LV PW:         1.00 cm  LV E/e' medial:  12.0 LV IVS:        1.00 cm  LV e' lateral:   4.03 cm/s LVOT diam:     1.90 cm  LV E/e' lateral: 14.6 LV SV:         48 LV SV Index:   21 LVOT Area:     2.84 cm  RIGHT VENTRICLE RV S prime:     6.74 cm/s TAPSE (M-mode): 1.4 cm LEFT ATRIUM             Index       RIGHT ATRIUM           Index LA diam:        3.50 cm 1.57 cm/m  RA Area:     16.50 cm LA Vol (A2C):   49.0 ml 22.02 ml/m RA Volume:   40.30 ml  18.11 ml/m LA Vol (A4C):   43.0 ml 19.33 ml/m LA Biplane Vol: 45.9 ml 20.63 ml/m  AORTIC VALVE LVOT Vmax:  90.10 cm/s LVOT Vmean:  59.900 cm/s LVOT VTI:    0.168 m AI PHT:      380 msec  AORTA Ao Root diam: 3.40 cm MITRAL VALVE               TRICUSPID VALVE MV Area (PHT): 3.37 cm    TR Peak grad:   38.7 mmHg MV Decel Time: 225 msec    TR Vmax:        311.00 cm/s MV E velocity: 58.70 cm/s MV A velocity: 76.30 cm/s  SHUNTS MV E/A ratio:  0.77        Systemic VTI:  0.17 m                            Systemic Diam: 1.90 cm Oswaldo Milian MD Electronically signed by Oswaldo Milian MD Signature Date/Time: 08/21/2020/3:10:28 PM    Final    VAS [place] LOWER EXTREMITY VENOUS (DVT)  Result Date: 08/21/2020  Lower Venous DVT Study Other Indications: RLE edema per chart. Comparison Study: No prior studies Performing Technologist: Rogelia Rohrer  Examination Guidelines: A complete evaluation includes B-mode imaging, spectral Doppler, color Doppler, and power Doppler as needed of all accessible portions of each vessel. Bilateral testing is considered an integral part of a complete examination. Limited examinations for reoccurring indications may be performed as noted. The reflux portion of the exam is performed with the patient in reverse Trendelenburg.  +---------+---------------+---------+-----------+----------+--------------+ RIGHT    CompressibilityPhasicitySpontaneityPropertiesThrombus Aging  +---------+---------------+---------+-----------+----------+--------------+ CFV      Full           Yes      Yes                                 +---------+---------------+---------+-----------+----------+--------------+ SFJ      Full                                                        +---------+---------------+---------+-----------+----------+--------------+ FV Prox  Full           Yes      Yes                                 +---------+---------------+---------+-----------+----------+--------------+ FV Mid   Full           Yes      Yes                                 +---------+---------------+---------+-----------+----------+--------------+ FV DistalFull           Yes      Yes                                 +---------+---------------+---------+-----------+----------+--------------+ PFV      Full                                                        +---------+---------------+---------+-----------+----------+--------------+  POP      Full           Yes      Yes                                 +---------+---------------+---------+-----------+----------+--------------+ PTV      Full                                                        +---------+---------------+---------+-----------+----------+--------------+ PERO     Full                                                        +---------+---------------+---------+-----------+----------+--------------+   +---------+---------------+---------+-----------+----------+--------------+ LEFT     CompressibilityPhasicitySpontaneityPropertiesThrombus Aging +---------+---------------+---------+-----------+----------+--------------+ CFV      Full           Yes      Yes                                 +---------+---------------+---------+-----------+----------+--------------+ SFJ      Full                                                         +---------+---------------+---------+-----------+----------+--------------+ FV Prox  Full           Yes      Yes                                 +---------+---------------+---------+-----------+----------+--------------+ FV Mid   Full           Yes      Yes                                 +---------+---------------+---------+-----------+----------+--------------+ FV DistalFull           Yes      Yes                                 +---------+---------------+---------+-----------+----------+--------------+ PFV      Full                                                        +---------+---------------+---------+-----------+----------+--------------+ POP      Full           Yes      Yes                                 +---------+---------------+---------+-----------+----------+--------------+ PTV  Full                                                        +---------+---------------+---------+-----------+----------+--------------+ PERO     Full                                                        +---------+---------------+---------+-----------+----------+--------------+     *See table(s) above for measurements and observations. Electronically signed by Jamelle Haring on 08/21/2020 at 5:31:04 PM.    Final     Microbiology: No results found for this or any previous visit (from the past 240 hour(s)).   Labs: Basic Metabolic Panel: No results for input(s): NA, K, CL, CO2, GLUCOSE, BUN, CREATININE, CALCIUM, MG, PHOS in the last 168 hours. Liver Function Tests: No results for input(s): AST, ALT, ALKPHOS, BILITOT, PROT, ALBUMIN in the last 168 hours. No results for input(s): LIPASE, AMYLASE in the last 168 hours. No results for input(s): AMMONIA in the last 168 hours. CBC: No results for input(s): WBC, NEUTROABS, HGB, HCT, MCV, PLT in the last 168 hours. Cardiac Enzymes: No results for input(s): CKTOTAL, CKMB, CKMBINDEX, TROPONINI in the last 168  hours. BNP: BNP (last 3 results) Recent Labs    08/02/2020 1020  BNP 153.4*    ProBNP (last 3 results) No results for input(s): PROBNP in the last 8760 hours.  CBG: No results for input(s): GLUCAP in the last 168 hours.  Principal Problem:   Acute respiratory failure with hypoxia (HCC) Active Problems:   Elevated troponin   Encephalopathy   Sepsis due to pneumonia ([place])   Protein calorie malnutrition ([place])   Aspiration pneumonia ([place])   Time coordinating discharge: 38 minutes  Signed:        Dartagnan Beavers, DO [facility]  09/04/2020, 2:57 PM

## 2020-09-24 NOTE — Progress Notes (Signed)
Daily Progress Note   Patient Name: Brian Wiley       Date: Sep 22, 2020 DOB: 03/13/1933  Age: 87 y.o. MRN#: [id number] Attending Physician: Fran Lowes, DO Primary Care Physician: Patient, [name] Admit Date: 08/12/2020  Reason for Consultation/Follow-up: Establishing goals of care  Subjective: Patient transitioned to comfort measures only by primary team yesterday evening. On my evaluation he looks to have labored breathing an audible secretions. Has not received any prn medications. Called patient's daughter and wife to discuss disposition- he is stable for transfer to residential hospice. They agree to referral to [facility].   ROS  Length of Stay: 6  Current Medications: Scheduled Meds:  . chlorhexidine  15 mL Mouth Rinse BID  . glycopyrrolate  0.4 mg Intravenous Q4H  . mouth rinse  15 mL Mouth Rinse q12n4p    Continuous Infusions: . morphine    . piperacillin-tazobactam (ZOSYN)  IV 3.375 g (September 22, 2020 0530)    PRN Meds: albuterol, bisacodyl, LORazepam, morphine injection, morphine, ondansetron **OR** ondansetron (ZOFRAN) IV  Physical Exam Vitals and nursing note reviewed.  Pulmonary:     Effort: Tachypnea and accessory muscle usage present.  Neurological:     Mental Status: He is disoriented.  Psychiatric:     Comments: wrestless             Vital Signs: BP (!) 155/95 (BP Location: Right Arm)   Pulse 86   Temp 97.6 F (36.4 C) (Axillary)   Resp 20   Ht 5\' 9"  (1.753 m)   Wt 108 kg   SpO2 97%   BMI 35.16 kg/m  SpO2: SpO2: 97 % O2 Device: O2 Device: High Flow Nasal Cannula O2 Flow Rate: O2 Flow Rate (L/min): 9 L/min  Intake/output summary:   Intake/Output Summary (Last 24 hours) at 09/22/20 1046 Last data filed at Sep 22, 2020 0810 Gross per 24 hour   Intake 955.46 ml  Output 1970 ml  Net -1014.54 ml   LBM: Last BM Date: 08/23/20 Baseline Weight: Weight: 108 kg Most recent weight: Weight: 108 kg       Palliative Assessment/Data: PPS: 10%      Patient Active Problem List   Diagnosis Date Noted  . Sepsis due to pneumonia (HCC) 08/13/2020  . Acute respiratory failure with hypoxia (HCC) 08/03/2020  . Protein calorie malnutrition (HCC) 07/31/2020  .  Aspiration pneumonia (HCC)   . Essential hypertension 06/21/2020  . AKI (acute kidney injury) (HCC) 06/18/2020  . Hypokalemia 06/18/2020  . Elevated troponin 06/18/2020  . Encephalopathy 06/18/2020    Palliative Care Assessment & Plan   Patient Profile: Brian Wiley a 87 y.o.malewith medical history significant ofhypertension, CVAin 1994, encephalopathy withsuspectedunderlying dementia, andBPH.He  was admitted with AMS and hypoxia likely felt to be 2/2 aspiration PNA.PCCM called evening 12/28 for worsening mental status 2/2 hypercapnia.This morning the patient's oxygen requirements have increased to 15L by Venti mask.  Palliative care was asked to get involved in the setting of worsening aspiration pneumonia. Patient on zosyn presently. Has complicated delirium which is worsening the clinical picture as patient has not been able to recognize the need to keep O2 on. He is not a candidate for bipap in the setting of AMS and aspirational events.   Assessment/Recommendations/Plan   Start morphine infusion with bolus dosing- discussed with nursing staff  [facility] referral for [facility]  Goals of Care and Additional Recommendations:  Limitations on Scope of Treatment: Full Comfort Care  Code Status:  DNR  Prognosis:   < 2 weeks  Discharge Planning:  Hospice facility  Care plan was discussed with patient's daughter, spouse and care team.   Thank you for allowing the Palliative Medicine Team to assist in the care of this patient.   Total time: 69  minutes  Greater than 50%  of this time was spent counseling and coordinating care related to the above assessment and plan.  Ocie Bob, AGNP-C Palliative Medicine   Please contact Palliative Medicine Team phone at [phone] for questions and concerns.

## 2020-09-24 NOTE — Progress Notes (Signed)
Patient past at 2220. Confirmed by charge nurse. NP on-call notified. Patient's daughter also notified via telephone.

## 2020-09-24 NOTE — Progress Notes (Signed)
PROGRESS NOTE  Brian Wiley OVZ:[id number] DOB: [birth year] DOA: 08/10/2020 PCP: Patient, [name]  Brief History   Brian Wiley is a 87 y.o. male who was admitted with AMS and hypoxia likely felt to be 2/2 aspiration PNA. PCCM called evening 12/28 for worsening mental status 2/2 hypercapnia. This morning the patient's oxygen requirements have increased to 15L by Venti mask.   Palliative care was consulted due to the patient's worsening status and very poor prognosis. He was unable to keep mask on his face to provide adequate oxygenation. Palliative care met with the family on 08/25/2020. At that point the family stated that they wished to continue the acute care course of treatment. However, the patient continued to decline throughout the day. Dr. Lonny Prude spoke with the family again on the afternoon of 09/24/2020. They have decided to make the patient full comfort care. Palliative care has met with the family again today. They are seeking placement in residential hospice at [facility].  Consultants  . PCCM . Palliative care  Procedures  . None  Antibiotics   Anti-infectives (From admission, onward)   Start     Dose/Rate Route Frequency Ordered Stop   08/21/20 1200  cefTRIAXone (ROCEPHIN) 2 g in sodium chloride 0.9 % 100 mL IVPB  Status:  Discontinued        2 g 200 mL/hr over 30 Minutes Intravenous Every 24 hours 08/10/2020 1443 08/01/2020 1840   08/21/20 1200  azithromycin (ZITHROMAX) 500 mg in sodium chloride 0.9 % 250 mL IVPB  Status:  Discontinued        500 mg 250 mL/hr over 60 Minutes Intravenous Every 24 hours 08/16/2020 1443 08/05/2020 1840   08/21/20 1200  azithromycin (ZITHROMAX) 500 mg in sodium chloride 0.9 % 250 mL IVPB        500 mg 250 mL/hr over 60 Minutes Intravenous Every 24 hours 08/14/2020 1916 08/24/20 1223   08/21/2020 1930  piperacillin-tazobactam (ZOSYN) IVPB 3.375 g  Status:  Discontinued        3.375 g 12.5 mL/hr over 240 Minutes Intravenous Every 8 hours 08/02/2020 1853  09-24-2020 2014   08/13/2020 1200  cefTRIAXone (ROCEPHIN) 1 g in sodium chloride 0.9 % 100 mL IVPB        1 g 200 mL/hr over 30 Minutes Intravenous  Once 08/10/2020 1154 08/05/2020 1311   08/22/2020 1200  azithromycin (ZITHROMAX) 500 mg in sodium chloride 0.9 % 250 mL IVPB        500 mg 250 mL/hr over 60 Minutes Intravenous  Once 07/29/2020 1154 08/11/2020 1328      Subjective  The patient is sleeping with coarse respirations and upper airway congestion.   Objective   Vitals:  Vitals:   09-24-20 0353 09/24/2020 0732  BP: (!) 169/91 (!) 155/95  Pulse: 82 86  Resp: 20 20  Temp: 98.2 F (36.8 C) 97.6 F (36.4 C)  SpO2: 99% 97%    Exam:  Constitutional:  . The patient is somnolent. Mild distress from respiratory congestion Respiratory:  . Positive for increased work of breathing with tachypnea. . No wheezes, rales, or rhonchi . No tactile fremitus . Coarse upper airway congestion Cardiovascular:  . Regular rate and rhythm . No murmurs, ectopy, or gallups. . No lateral PMI. No thrills. Abdomen:  . Abdomen is soft, non-tender, non-distended . No hernias, masses, or organomegaly . Normoactive bowel sounds.  Musculoskeletal:  . No cyanosis, clubbing, or edema Skin:  . No rashes, lesions, ulcers . palpation of skin: no  induration or nodules Neurologic:  . Patient is unable to cooperate with exam. Psychiatric:  . Patient is unable to cooperate with exam  I have personally reviewed the following:   Today's Data  . Orthoptist  . Blood culture x 2: No growth  Imaging  . CXR  Scheduled Meds: . chlorhexidine  15 mL Mouth Rinse BID  . glycopyrrolate  0.1 mg Intravenous QID  . mouth rinse  15 mL Mouth Rinse q12n4p   Continuous Infusions: . morphine 1 mg/hr (27-Aug-2020 1200)    Principal Problem:   Acute respiratory failure with hypoxia (HCC) Active Problems:   Elevated troponin   Encephalopathy   Sepsis due to pneumonia ([place])   Protein calorie malnutrition (HCC)    Aspiration pneumonia (HCC)   LOS: 6 days    A & P  Acute respiratory failure with hypoxia: The patient is currently on 9l bhy HFNC. Morphine for air hunger. Robinul added for upper airway secretions. .  Aspiration pneumonia: Comfort care only.  Sepsis: Due to pneumonia. Resolved.   Protein calorie malnutrition: Comfort care only.  Encephalopathy: Comfort care only.  I have seen and examined this patient myself. I have spent 38 minutes in his evaluation and care.  DVT Prophylaxis: Lovenox CODE STATUS: DNR Family Communication: None available Disposition:  Status is: Inpatient  Remains inpatient appropriate because:Inpatient level of care appropriate due to severity of illness  Dispo: The patient is from: SNF              Anticipated d/c is to: SNF              Anticipated d/c date is: 2 days              Patient currently is not medically stable to d/c.   Evan Osburn, DO [facility] Direct contact: see [email or web address]  7PM-7AM contact night coverage as above 08/27/20, 8:21 PM  LOS: 4 days

## 2020-09-24 NOTE — TOC Initial Note (Signed)
Transition of Care (TOC) - Initial/Assessment Note  Donn Pierini RN, BSN Transitions of Care Unit 4E- RN Case Manager See Treatment Team for direct phone #    Patient Details  Name: Brian Wiley MRN: [id number] Date of Birth: 1933-07-16  Transition of [facility]) CM/SW Contact:    Darrold Span, RN Phone Number: 09/09/2020, 3:02 PM  Clinical Narrative:                 Referral received for [facility]- pt has been made Comfort Care, call made to Chales Abrahams with [facility] for [facility]/Hospice referral- per Chales Abrahams there are no [facility] beds today- she will f/u on referral.   Expected Discharge Plan: Skilled Nursing Facility Barriers to Discharge: Hospice Bed not available   Patient Goals and CMS Choice Patient states their goals for this hospitalization and ongoing recovery are:: Comfort Care CMS Medicare.gov Compare Post Acute Care list provided to:: Patient Represenative (must comment) Choice offered to / list presented to : Spouse,Adult Children  Expected Discharge Plan and Services Expected Discharge Plan: Skilled Nursing Facility In-house Referral: Clinical Social Work Discharge Planning Services: CM Consult Post Acute Care Choice: Hospice Living arrangements for the past 2 months: Single Family Home                                      Prior Living Arrangements/Services Living arrangements for the past 2 months: Single Family Home Lives with:: Self Patient language and need for interpreter reviewed:: Yes        Need for Family Participation in Patient Care: Yes (Comment) Care giver support system in place?: Yes (comment)   Criminal Activity/Legal Involvement Pertinent to Current Situation/Hospitalization: No - Comment as needed  Activities of Daily Living      Permission Sought/Granted Permission sought to share information with : Oceanographer granted to share information with : Yes, Verbal Permission  Granted     Permission granted to share info w AGENCY: Hospice        Emotional Assessment       Orientation: : Oriented to Self Alcohol / Substance Use: Not Applicable Psych Involvement: No (comment)  Admission diagnosis:  Elevated troponin [R77.8] Acute respiratory failure with hypoxia (HCC) [J96.01] Sepsis due to pneumonia (HCC) [J18.9, A41.9] Community acquired pneumonia of right upper lobe of lung [J18.9] Community acquired pneumonia of right middle lobe of lung [J18.9] Patient Active Problem List   Diagnosis Date Noted  . Sepsis due to pneumonia (HCC) 08/15/2020  . Acute respiratory failure with hypoxia (HCC) 08/02/2020  . Protein calorie malnutrition (HCC) 08/09/2020  . Aspiration pneumonia (HCC)   . Essential hypertension 06/21/2020  . AKI (acute kidney injury) (HCC) 06/18/2020  . Hypokalemia 06/18/2020  . Elevated troponin 06/18/2020  . Encephalopathy 06/18/2020   PCP:  Patient, [name] Pharmacy:   [id number] - [place], [place] - [address] AT [facility] [address] [place] [address] Phone: [phone] Fax: [phone]     Social Determinants of Health (SDOH) Interventions    Readmission Risk Interventions No flowsheet data found.

## 2020-09-24 NOTE — Progress Notes (Signed)
Patient transferred to [facility], report given to [facility], [place].

## 2020-09-24 DEATH — deceased

## 2020-11-30 IMAGING — CT CT HEAD W/O CM
4 series · 16 of 47 positions shown, 18 images · non-contrast
Comparison: None.

CLINICAL DATA: Mental status change

EXAM:
CT HEAD WITHOUT CONTRAST
TECHNIQUE: Contiguous axial images were obtained from the base of the skull
through the vertex without intravenous contrast.

[Series 3: head without · axial · non-contrast · 0.44mm/px · z∈[-49,+71]mm · 7 of 32 slices shown, 9 images]
[im 4/32  brain]
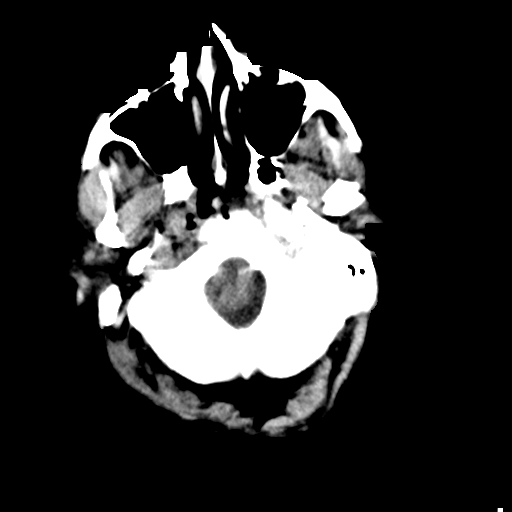
[im 4/32  bone]
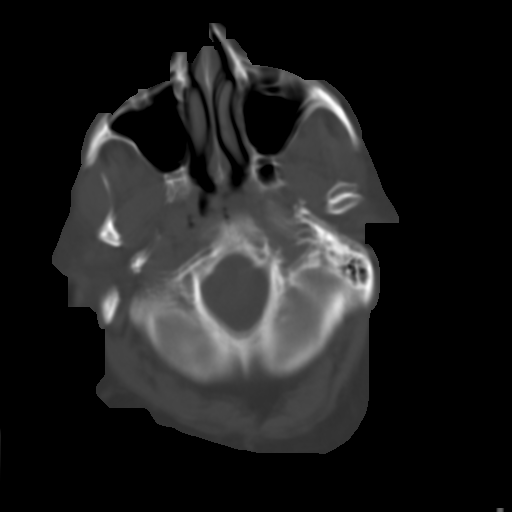
[im 8/32  brain]
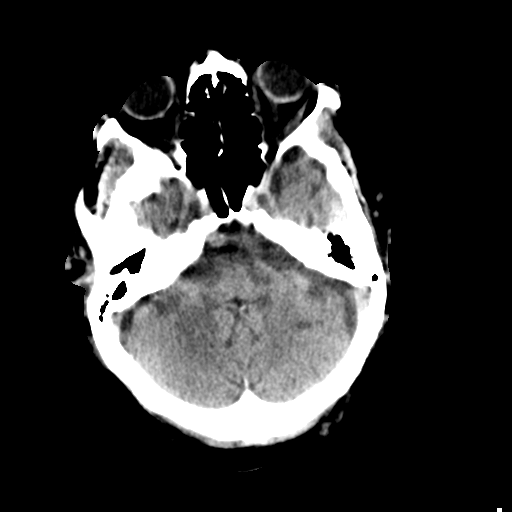
[im 12/32  brain]
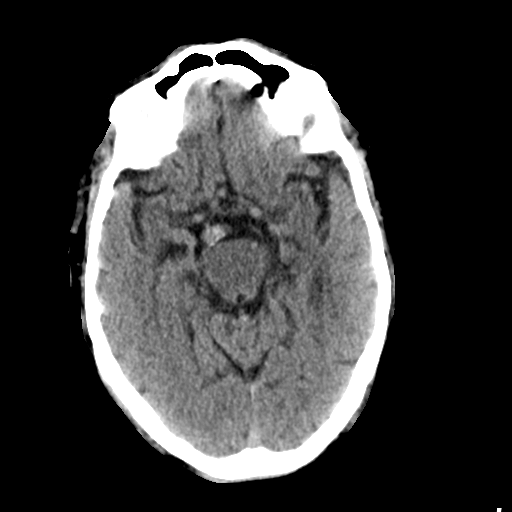
[im 16/32  brain]
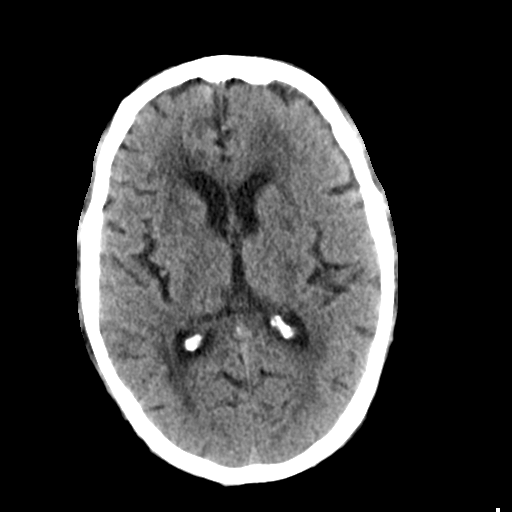
[im 20/32  brain]
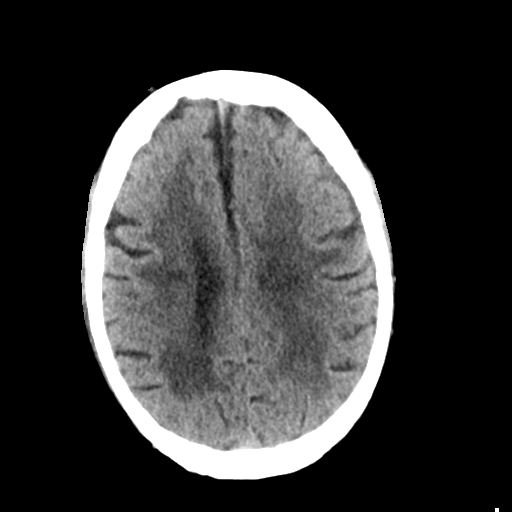
[im 20/32  bone]
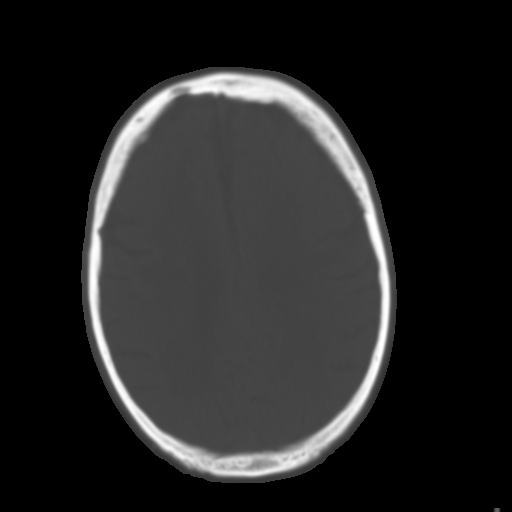
[im 24/32  brain]
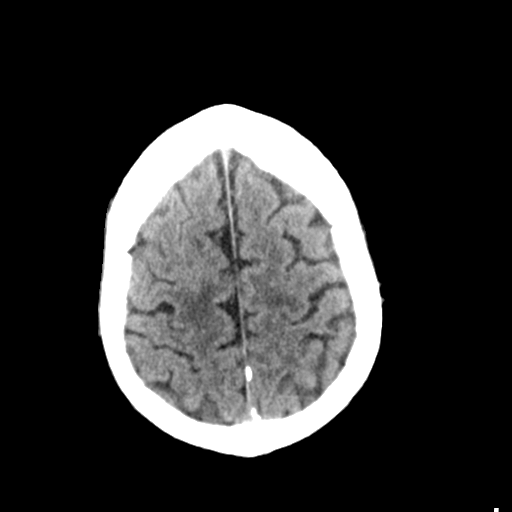
[im 28/32  brain]
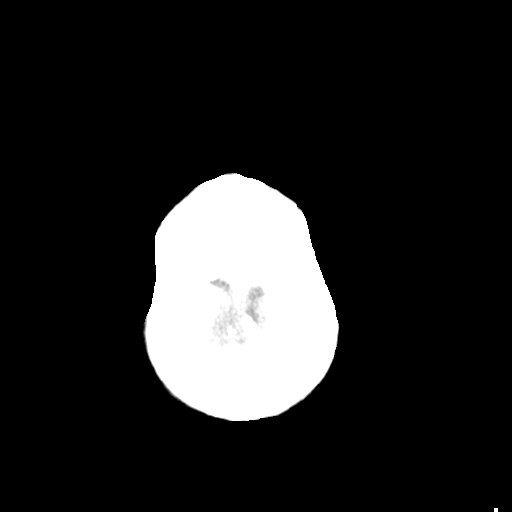

[Series 4: head bone · axial · 0.44mm/px · z∈[-50,-18]mm · 3 of 78 slices shown]
[im 8/78  bone]
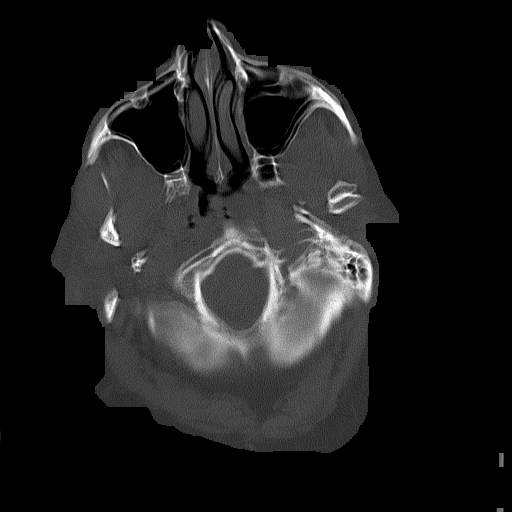
[im 16/78  bone]
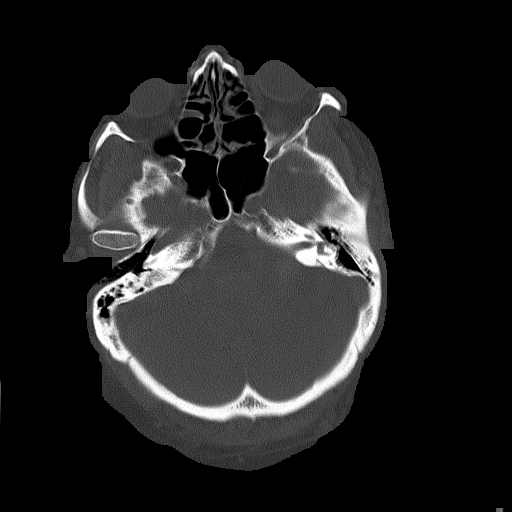
[im 24/78  bone]
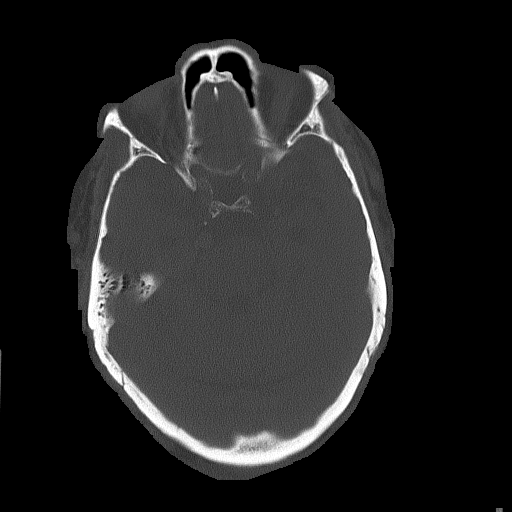

[Series 5: head without cor · coronal · non-contrast · 0.30mm/px · 3 of 70 slices shown]
[im 24/70  brain]
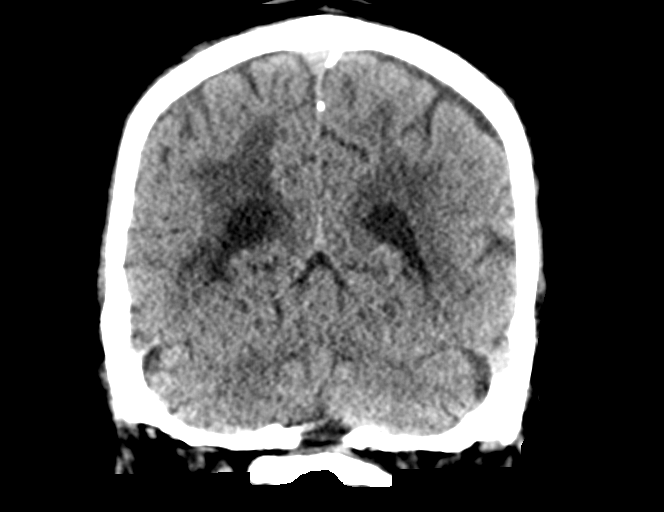
[im 31/70  brain]
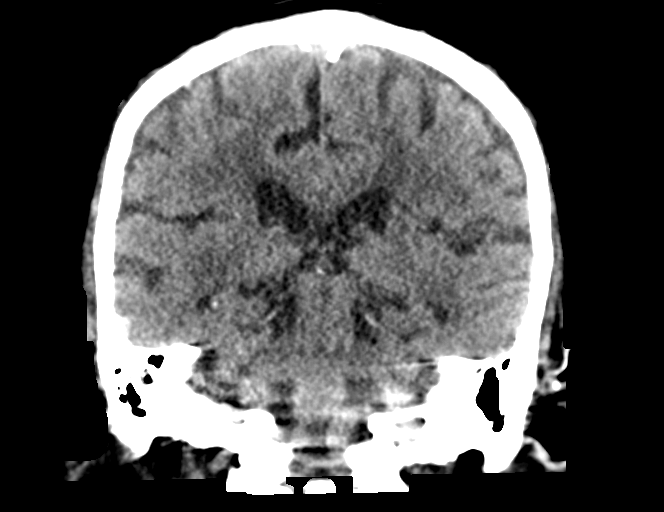
[im 39/70  brain]
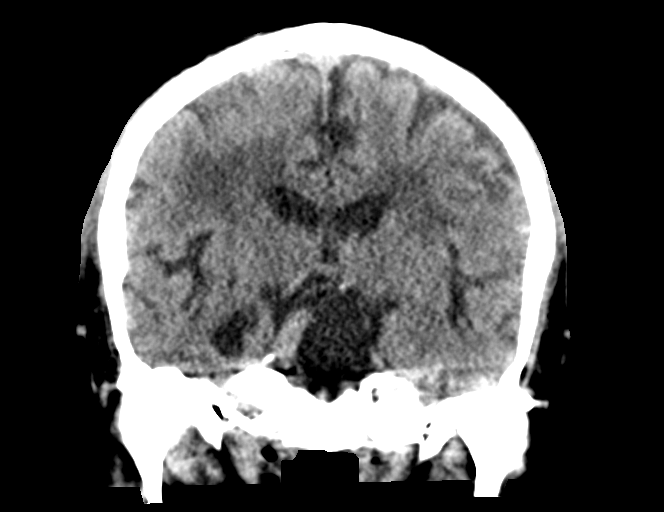

[Series 6: head without sag · sagittal · non-contrast · 0.30mm/px · 3 of 66 slices shown]
[im 22/66  brain]
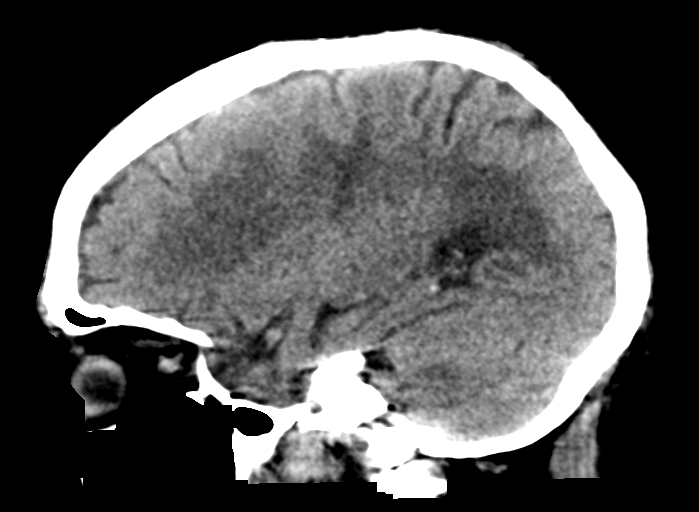
[im 33/66  brain]
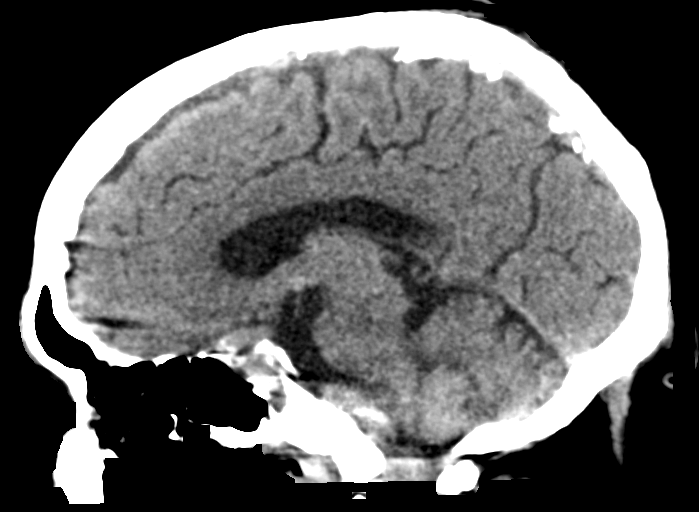
[im 44/66  brain]
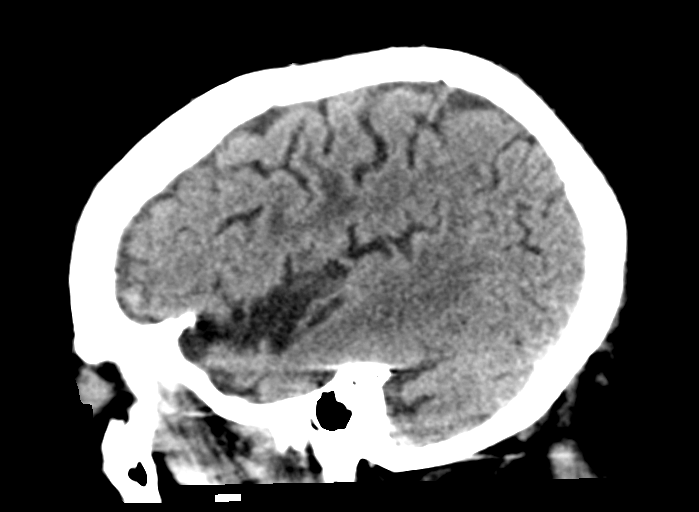

[16 of 47 positions shown; findings below may reference images not displayed]

FINDINGS: Brain: Mild motion degradation. No acute territorial infarction,
hemorrhage or intracranial mass. Extensive white matter hypodensity.
Mild atrophy. Nonenlarged ventricles. Chronic appearing small
infarct in the left cerebellum.

Vascular: Dolichoectasia of the basilar artery. Carotid vascular
calcifications.

Skull: Normal. Negative for fracture or focal lesion.

Sinuses/Orbits: Patchy mucosal thickening in the sinuses

Other: None
IMPRESSION: 1. No CT evidence for acute intracranial abnormality allowing for
mild motion degradation.
2. Atrophy and small vessel ischemic changes of the white matter.
Chronic appearing small infarct in the left cerebellum.

## 2020-12-05 IMAGING — DX DG ABD PORTABLE 1V
1 series · 1 of 1 positions shown · non-contrast
Comparison: None.

CLINICAL DATA: Altered mental status weakness

EXAM:
PORTABLE ABDOMEN - 1 VIEW

[abdomen kub]
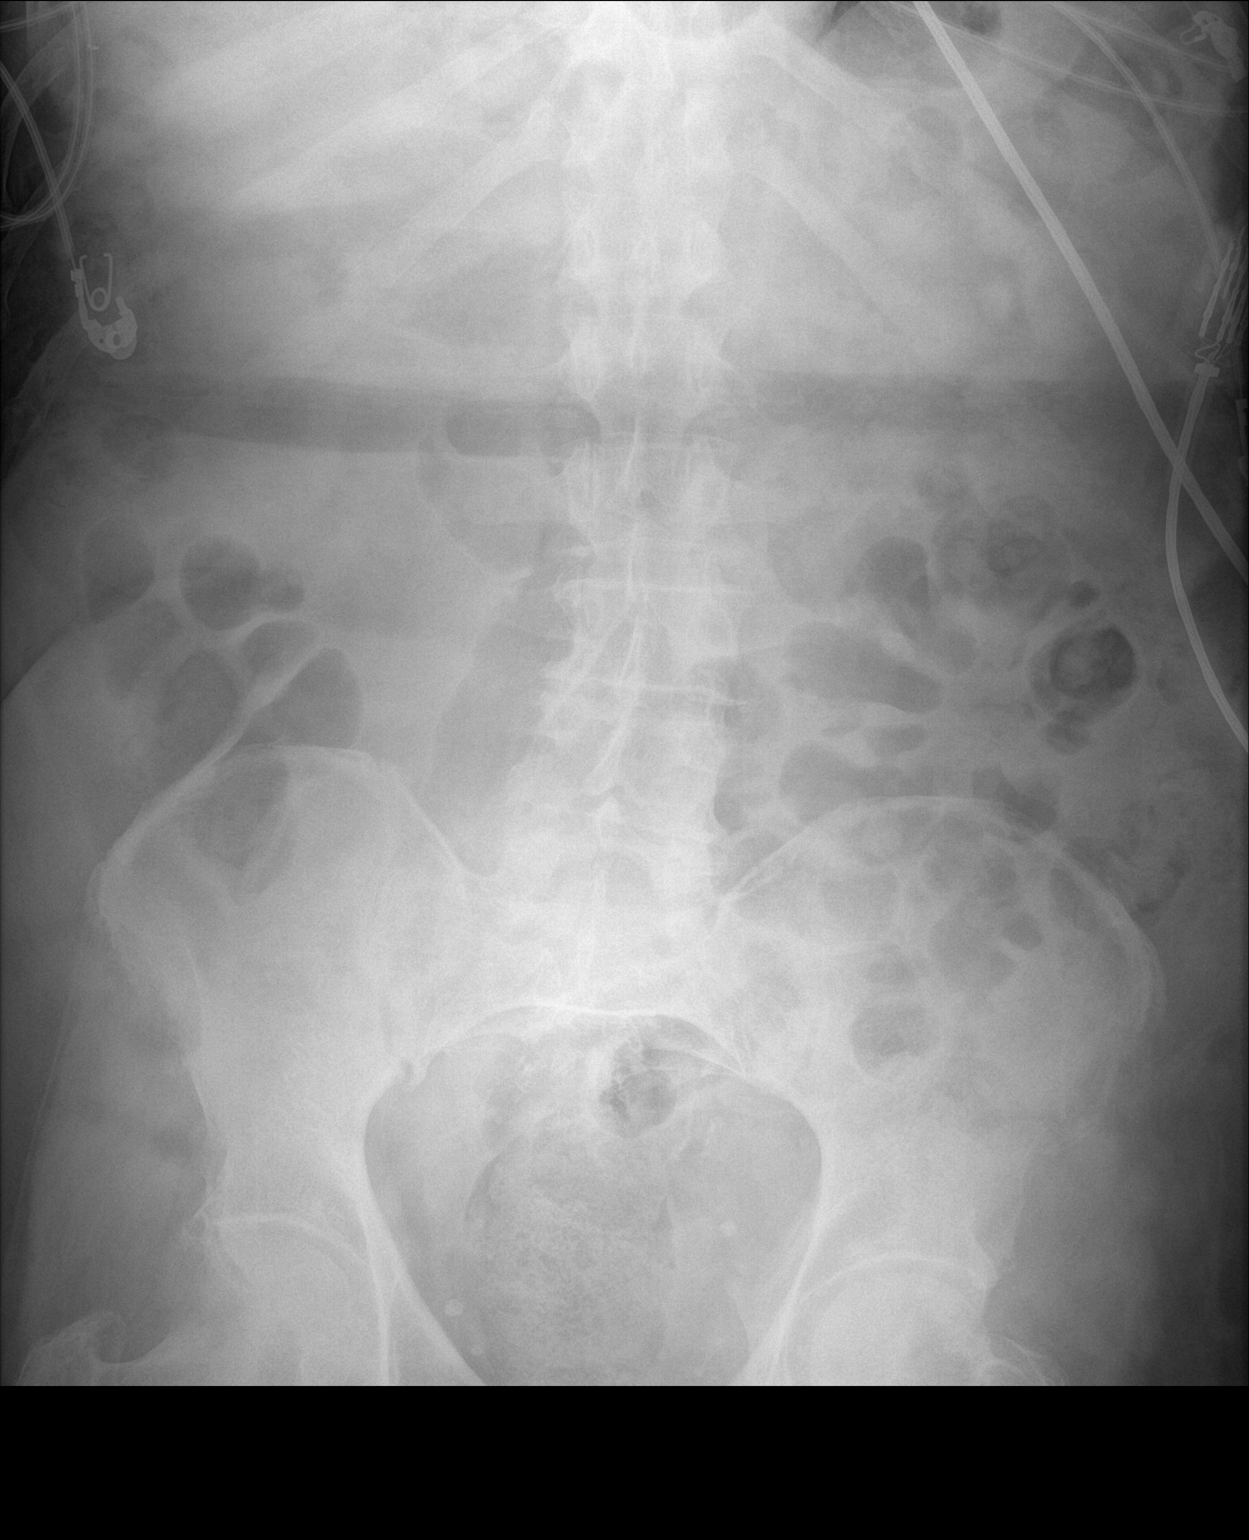

[1 of 1 positions shown; findings below may reference images not displayed]

FINDINGS: The bowel gas pattern is normal. No radio-opaque calculi or other
significant radiographic abnormality are seen. Phleboliths in the
pelvis
IMPRESSION: Negative.
